# Patient Record
Sex: Female | Born: 1965 | Race: White | Hispanic: No | Smoking: Former smoker
Health system: Southern US, Community
[De-identification: ages and names within clinical notes are randomized; demographics above are authoritative.]

## PROBLEM LIST (undated history)

## (undated) DIAGNOSIS — F32A Depression, unspecified: Secondary | ICD-10-CM

## (undated) DIAGNOSIS — R87619 Unspecified abnormal cytological findings in specimens from cervix uteri: Secondary | ICD-10-CM

## (undated) DIAGNOSIS — F319 Bipolar disorder, unspecified: Secondary | ICD-10-CM

## (undated) DIAGNOSIS — F1910 Other psychoactive substance abuse, uncomplicated: Secondary | ICD-10-CM

## (undated) DIAGNOSIS — F419 Anxiety disorder, unspecified: Secondary | ICD-10-CM

## (undated) HISTORY — DX: Bipolar disorder, unspecified: F31.9

## (undated) HISTORY — PX: COSMETIC SURGERY: SHX468

## (undated) HISTORY — DX: Other psychoactive substance abuse, uncomplicated: F19.10

## (undated) HISTORY — PX: EYE SURGERY: SHX253

## (undated) HISTORY — PX: SPINE SURGERY: SHX786

## (undated) HISTORY — DX: Unspecified abnormal cytological findings in specimens from cervix uteri: R87.619

## (undated) HISTORY — PX: CERVICAL BIOPSY  W/ LOOP ELECTRODE EXCISION: SUR135

## (undated) HISTORY — PX: AUGMENTATION MAMMAPLASTY: SUR837

## (undated) HISTORY — PX: CRYOTHERAPY: SHX1416

## (undated) HISTORY — PX: BREAST SURGERY: SHX581

## (undated) HISTORY — DX: Depression, unspecified: F32.A

## (undated) HISTORY — DX: Anxiety disorder, unspecified: F41.9

---

## 2018-08-01 DIAGNOSIS — T1491XA Suicide attempt, initial encounter: Secondary | ICD-10-CM

## 2018-08-01 HISTORY — DX: Suicide attempt, initial encounter: T14.91XA

## 2019-08-24 DIAGNOSIS — F3181 Bipolar II disorder: Secondary | ICD-10-CM | POA: Insufficient documentation

## 2021-04-07 ENCOUNTER — Telehealth (HOSPITAL_COMMUNITY): Payer: Self-pay | Admitting: Licensed Clinical Social Worker

## 2021-04-09 NOTE — Telephone Encounter (Signed)
Cln oriented pt to PHP and offered virtual intake appt on 9/19 and pt states she cannot make that appt. Pt accepts appt on 9/20 at 1pm. Pt states current SI and denies plan/intent and states she is safe. Cln discussed pt voluntarily presenting to the ED to maintain safety and pt again denies plan or intent and states she is not interested in going inpatient. Cln provided info on [facility] and [facility] and encouraged pt to call or present at [facility]/ED if her SI worsens. Pt states she will.

## 2021-04-20 ENCOUNTER — Telehealth (HOSPITAL_COMMUNITY): Payer: Self-pay | Admitting: Licensed Clinical Social Worker

## 2021-04-20 ENCOUNTER — Other Ambulatory Visit (HOSPITAL_COMMUNITY): Payer: 59 | Attending: Psychiatry

## 2021-04-20 ENCOUNTER — Other Ambulatory Visit: Payer: Self-pay | Admitting: Physician Assistant

## 2021-04-20 ENCOUNTER — Other Ambulatory Visit: Payer: Self-pay

## 2021-04-20 ENCOUNTER — Ambulatory Visit
Admission: RE | Admit: 2021-04-20 | Discharge: 2021-04-20 | Disposition: A | Discharge status: Home / Self Care | Payer: 59 | Source: Ambulatory Visit | Attending: Physician Assistant | Admitting: Physician Assistant

## 2021-04-20 DIAGNOSIS — M545 Low back pain, unspecified: Secondary | ICD-10-CM

## 2021-05-25 ENCOUNTER — Telehealth (HOSPITAL_COMMUNITY): Payer: Self-pay | Admitting: Licensed Clinical Social Worker

## 2021-05-28 ENCOUNTER — Ambulatory Visit (INDEPENDENT_AMBULATORY_CARE_PROVIDER_SITE_OTHER): Payer: 59 | Admitting: Licensed Clinical Social Worker

## 2021-05-28 ENCOUNTER — Other Ambulatory Visit: Payer: Self-pay

## 2021-05-28 DIAGNOSIS — F1010 Alcohol abuse, uncomplicated: Secondary | ICD-10-CM | POA: Diagnosis not present

## 2021-05-28 DIAGNOSIS — F1510 Other stimulant abuse, uncomplicated: Secondary | ICD-10-CM

## 2021-05-28 DIAGNOSIS — F1994 Other psychoactive substance use, unspecified with psychoactive substance-induced mood disorder: Secondary | ICD-10-CM | POA: Diagnosis not present
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
## 2021-06-04 ENCOUNTER — Telehealth (HOSPITAL_COMMUNITY): Payer: Self-pay | Admitting: Licensed Clinical Social Worker

## 2021-06-07 ENCOUNTER — Telehealth (HOSPITAL_COMMUNITY): Payer: Self-pay | Admitting: Professional

## 2021-06-08 ENCOUNTER — Other Ambulatory Visit: Payer: Self-pay

## 2021-06-08 ENCOUNTER — Ambulatory Visit (INDEPENDENT_AMBULATORY_CARE_PROVIDER_SITE_OTHER): Payer: 59 | Admitting: Licensed Clinical Social Worker

## 2021-06-08 DIAGNOSIS — F1590 Other stimulant use, unspecified, uncomplicated: Secondary | ICD-10-CM

## 2021-06-08 NOTE — Progress Notes (Signed)
   THERAPIST PROGRESS NOTE  Session Time: 06-1144  Participation Level: Active  Behavioral Response: CasualAlertAnxious  Type of Therapy: Individual Therapy  Treatment Goals addressed: Coping and Diagnosis: stimulant use disorder  Interventions: CBT and Supportive  Summary: Ellise Kovack is a 55 y.o. female who presents with stimulant use disorder, substance induced mood disorder. Client showed progress toward goal of maintained sobriety 7/7 days weekly AEB maintained sobriety since previous session. Client continues to build sober support community by attending weekly meetings and has plan to obtain part time employment to help with trigger of boredom.   Suicidal/Homicidal: No  Therapist Response: Clinician checked in with client, assessed for SI/HI/psychosis. Clinician inquired about recent use, cravings, and 12 step meeting attendance. Clinician presented psycho-education on CBT triangle and the connection of thoughts, feelings, and behaviors. Clinician discussed with client the importance of repeating helpful thoughts against using. Clinician processed with client addiction as disease not moral failure. Clinician showed client brain scans related to pre, during, and post substance use and provided educational video for viewing prior to next session.   Plan: Return again in 2 weeks.  Diagnosis: Axis I:  stimulant use disorder        [name], LCSW 06/08/2021

## 2021-06-10 LAB — COLOGUARD: Cologuard: NEGATIVE

## 2021-06-17 LAB — COLOGUARD: COLOGUARD: NEGATIVE

## 2021-06-17 LAB — EXTERNAL GENERIC LAB PROCEDURE: COLOGUARD: NEGATIVE

## 2021-06-22 ENCOUNTER — Ambulatory Visit (HOSPITAL_COMMUNITY): Payer: 59 | Admitting: Licensed Clinical Social Worker

## 2021-06-29 ENCOUNTER — Ambulatory Visit (HOSPITAL_COMMUNITY): Payer: 59 | Admitting: Licensed Clinical Social Worker

## 2021-06-30 ENCOUNTER — Ambulatory Visit (INDEPENDENT_AMBULATORY_CARE_PROVIDER_SITE_OTHER): Payer: 59 | Admitting: Licensed Clinical Social Worker

## 2021-06-30 ENCOUNTER — Other Ambulatory Visit: Payer: Self-pay

## 2021-06-30 DIAGNOSIS — F1994 Other psychoactive substance use, unspecified with psychoactive substance-induced mood disorder: Secondary | ICD-10-CM

## 2021-06-30 DIAGNOSIS — F1510 Other stimulant abuse, uncomplicated: Secondary | ICD-10-CM | POA: Diagnosis not present

## 2021-06-30 NOTE — Progress Notes (Signed)
   THERAPIST PROGRESS NOTE  Session Time: 805-850  Participation Level: Active  Behavioral Response: Casual and NeatAlertEuthymic  Type of Therapy: Individual Therapy  Treatment Goals addressed: Coping and Diagnosis: substance use disorder  Interventions: CBT, Supportive, and Other: psycho-education  Summary: Maria Pineda is a 55 y.o. female who presents with substance use disorder. Client showed progress toward goal of achieving/maintaining sobriety 7/7 days weekly AEB reported 20 days sober. Client showed progress toward goal of building sober support system AEB finding new sponsor and continued regular attendance at [employer]. Client was receptive to psycho-education and identified some distorted roles in family of origin vs nuclear family.  Suicidal/Homicidal: No  Therapist Response: Clinician checked in with client, assessed for SI/HI/psychosis and overall level of functioning. Client presented fully oriented and did not appear intoxicated or psychotic at the time of assessment. Clinician presented psycho-educational information on dysfunctional family roles and the effect on behaviors as an adult. Following session to focus on ACOA traits.  Plan: Return again in 2 weeks.  Diagnosis: Axis I: Substance Abuse       [name], LCSW 06/30/2021

## 2021-07-14 ENCOUNTER — Ambulatory Visit (HOSPITAL_COMMUNITY): Payer: 59 | Admitting: Licensed Clinical Social Worker

## 2021-07-15 ENCOUNTER — Ambulatory Visit (HOSPITAL_COMMUNITY): Payer: 59 | Admitting: Licensed Clinical Social Worker

## 2021-07-21 ENCOUNTER — Ambulatory Visit (HOSPITAL_COMMUNITY): Payer: 59 | Admitting: Licensed Clinical Social Worker

## 2021-07-23 ENCOUNTER — Ambulatory Visit (HOSPITAL_COMMUNITY): Payer: 59 | Admitting: Licensed Clinical Social Worker

## 2021-07-23 ENCOUNTER — Other Ambulatory Visit: Payer: Self-pay

## 2021-07-23 NOTE — Progress Notes (Incomplete)
Virtual Visit via Telephone Note  I connected with Maria Pineda on 07/23/2021 at  8:00 AM EST by telephone and verified that I am speaking with the correct person using two identifiers.  Location: Patient: home Provider: office   I discussed the limitations, risks, security and privacy concerns of performing an evaluation and management service by telephone and the availability of in person appointments. I also discussed with the patient that there may be a patient responsible charge related to this service. The patient expressed understanding and agreed to proceed.  I discussed the assessment and treatment plan with the patient. The patient was provided an opportunity to ask questions and all were answered. The patient agreed with the plan and demonstrated an understanding of the instructions.   The patient was advised to call back or seek an in-person evaluation if the symptoms worsen or if the condition fails to improve as anticipated.  I provided 45 minutes of non-face-to-face time during this encounter.   [name], LCSW    THERAPIST PROGRESS NOTE  Session Time: 805-850am  Participation Level: Active  Behavioral Response: NAAlertEuthymic  Type of Therapy: Individual Therapy  Treatment Goals addressed: Coping; client will maintain sobriety from all mind altering substances 7/7 days weekly.  Interventions: CBT, Supportive, and Other: psycho-education  Summary: Maria Pineda is a 55 y.o. female who presents with stimulant use disorder.   Suicidal/Homicidal: No  Therapist Response: ***[place]; follow up with guilt/shame  Plan: Return again in *** weeks.  Diagnosis: Axis I: {psych axis 1:31909}    Axis II: {psych axis 2:31910}    [name], LCSW 07/23/2021

## 2021-07-27 ENCOUNTER — Other Ambulatory Visit (HOSPITAL_BASED_OUTPATIENT_CLINIC_OR_DEPARTMENT_OTHER): Payer: Self-pay | Admitting: Family Medicine

## 2021-07-27 ENCOUNTER — Telehealth (HOSPITAL_BASED_OUTPATIENT_CLINIC_OR_DEPARTMENT_OTHER): Payer: Self-pay

## 2021-07-27 DIAGNOSIS — Z1231 Encounter for screening mammogram for malignant neoplasm of breast: Secondary | ICD-10-CM

## 2021-08-05 ENCOUNTER — Other Ambulatory Visit: Payer: Self-pay

## 2021-08-05 ENCOUNTER — Ambulatory Visit (INDEPENDENT_AMBULATORY_CARE_PROVIDER_SITE_OTHER): Payer: 59 | Admitting: Licensed Clinical Social Worker

## 2021-08-05 DIAGNOSIS — F1510 Other stimulant abuse, uncomplicated: Secondary | ICD-10-CM | POA: Diagnosis not present

## 2021-08-05 NOTE — Progress Notes (Signed)
°  Virtual Visit via Video Note  I connected with Maria Pineda on 08/05/2021 at  9:00 AM EST by a video enabled telemedicine application and verified that I am speaking with the correct person using two identifiers.  Location: Patient: home Provider: office   I discussed the limitations of evaluation and management by telemedicine and the availability of in person appointments. The patient expressed understanding and agreed to proceed.    I discussed the assessment and treatment plan with the patient. The patient was provided an opportunity to ask questions and all were answered. The patient agreed with the plan and demonstrated an understanding of the instructions.   The patient was advised to call back or seek an in-person evaluation if the symptoms worsen or if the condition fails to improve as anticipated.  I provided 45 minutes of non-face-to-face time during this encounter.   [name], LCSW   THERAPIST PROGRESS NOTE  Session Time: [phone]  Participation Level: Active  Behavioral Response: CasualAlertDysphoric  Type of Therapy: Individual Therapy  Treatment Goals addressed: Coping and Diagnosis: stimulant abuse  Interventions: CBT and Supportive  Summary: Maria Pineda is a 56 y.o. female who presents with stimulant use disorder, early remission. Clinician and presented skill of Radical acceptance. Clinician and client processed the role of acceptance in recovery. Clinician and client processed the roll of acceptance in relation to guilt and relationship with children. Clinician validated client thoughts and feelings. Clinician challenged client distorted thinking and focused on fact vs feelings. Client was receptive to skill though identified and voiced challenges for her on this topic. Client showed progress toward goal AEB maintained sobriety 7/7 days weekly. Client reported changes in work schedule are helpful and clinician challenged client to engage in leisure  activities and engage with community support members to address boredom or isolation.  Suicidal/Homicidal: No    Plan: Return again in 2 weeks.  Diagnosis: Axis I: Substance Abuse       [name], LCSW 08/05/2021

## 2021-08-06 ENCOUNTER — Ambulatory Visit (HOSPITAL_BASED_OUTPATIENT_CLINIC_OR_DEPARTMENT_OTHER)
Admission: RE | Admit: 2021-08-06 | Discharge: 2021-08-06 | Disposition: A | Discharge status: Home / Self Care | Payer: 59 | Source: Ambulatory Visit | Attending: Family Medicine | Admitting: Family Medicine

## 2021-08-06 ENCOUNTER — Other Ambulatory Visit: Payer: Self-pay

## 2021-08-06 ENCOUNTER — Encounter (HOSPITAL_BASED_OUTPATIENT_CLINIC_OR_DEPARTMENT_OTHER): Payer: Self-pay

## 2021-08-06 DIAGNOSIS — Z1231 Encounter for screening mammogram for malignant neoplasm of breast: Secondary | ICD-10-CM | POA: Insufficient documentation

## 2021-08-12 ENCOUNTER — Ambulatory Visit (INDEPENDENT_AMBULATORY_CARE_PROVIDER_SITE_OTHER): Payer: 59 | Admitting: Licensed Clinical Social Worker

## 2021-08-12 ENCOUNTER — Other Ambulatory Visit: Payer: Self-pay

## 2021-08-12 DIAGNOSIS — F1994 Other psychoactive substance use, unspecified with psychoactive substance-induced mood disorder: Secondary | ICD-10-CM | POA: Diagnosis not present

## 2021-08-12 DIAGNOSIS — F1510 Other stimulant abuse, uncomplicated: Secondary | ICD-10-CM | POA: Diagnosis not present

## 2021-08-12 NOTE — Progress Notes (Signed)
Virtual Visit via Video Note  I connected with Maria Pineda on 08/12/21 at  9:00 AM EST by a video enabled telemedicine application and verified that I am speaking with the correct person using two identifiers.  Location: Patient: home Provider: office   I discussed the limitations of evaluation and management by telemedicine and the availability of in person appointments. The patient expressed understanding and agreed to proceed.   I discussed the assessment and treatment plan with the patient. The patient was provided an opportunity to ask questions and all were answered. The patient agreed with the plan and demonstrated an understanding of the instructions.   The patient was advised to call back or seek an in-person evaluation if the symptoms worsen or if the condition fails to improve as anticipated.  I provided 45 minutes of non-face-to-face time during this encounter.   [name], LCSW    THERAPIST PROGRESS NOTE  Session Time: [phone]  Participation Level: Active  Behavioral Response: CasualAlert"neutral"  Type of Therapy: Individual Therapy  Treatment Goals addressed: Coping and Diagnosis: maintain sobriety from all mind/mood altering substances 7/7 days weekly.  Interventions: CBT, Motivational Interviewing, and Supportive  Summary: Maria Pineda is a 56 y.o. female who presents with stimulant use disorder, no current use. Client shared work going well however need for more engagement in social activities outside of the home to minimize boredom and isolation. Client continues to struggle with guilt and shame around using substances when children were younger and possible effect of them in addition to current lack of relationship. Client showed progress toward goal of maintained sobriety AEB no substance use since previous session.  Suicidal/Homicidal: No  Therapist Response: Clinician checked in with client, assessed for SI/HI/psychosis and overall level of  functioning. Clinician processed with client addiction as a disease vs a moral failure. Clinician worked with client on concept of radical acceptance to avoid suffering and move forward. Clinician and client spoke about differences in healthy guilt vs unhealthy guilt vs shame. Clinician validated feelings and attempted to support client in current healthy decisions being made for self and family. Clinician spoke with client about transition to new therapist.  Plan: Return again in 1-2 weeks.  Diagnosis: Axis I: Substance Abuse and Substance Induced Mood Disorder        [name], LCSW 08/12/2021

## 2021-08-20 ENCOUNTER — Other Ambulatory Visit: Payer: Self-pay

## 2021-08-20 ENCOUNTER — Ambulatory Visit (INDEPENDENT_AMBULATORY_CARE_PROVIDER_SITE_OTHER): Payer: 59 | Admitting: Licensed Clinical Social Worker

## 2021-08-20 DIAGNOSIS — F1510 Other stimulant abuse, uncomplicated: Secondary | ICD-10-CM | POA: Diagnosis not present

## 2021-08-23 NOTE — Progress Notes (Signed)
Virtual Visit via Video Note  I connected with Maria Pineda on 08/20/2021 at  9:00 AM EST by a video enabled telemedicine application and verified that I am speaking with the correct person using two identifiers.  Location: Patient: home Provider: home office   I discussed the limitations of evaluation and management by telemedicine and the availability of in person appointments. The patient expressed understanding and agreed to proceed.  I discussed the assessment and treatment plan with the patient. The patient was provided an opportunity to ask questions and all were answered. The patient agreed with the plan and demonstrated an understanding of the instructions.   The patient was advised to call back or seek an in-person evaluation if the symptoms worsen or if the condition fails to improve as anticipated.  I provided 45 minutes of non-face-to-face time during this encounter.   [name], LCSW    THERAPIST PROGRESS NOTE  Session Time: [phone]  Participation Level: Active  Behavioral Response: Casual and NeatAlertDysphoric  Type of Therapy: Individual Therapy  Treatment Goals addressed: Coping and Diagnosis: Client will maintain sobriety 7/7 days weekly.  Interventions: CBT and Supportive  Summary: Maria Pineda is a 56 y.o. female who presents with stimulate use disorder in early remission. Client processed increase in work and problem solving for younger co-workers as well as decrease in engagement with personal recovery. Client worked to identify co-dependent behaviors previously and current pattern. Client identified relevant information from ACOA laundry list in behaviors including guilt around taking care of self and possible enabling behaviors of putting work and others before personal recovery.   Suicidal/Homicidal: No  Therapist Response: Clinician checked in with client, assessed for SI/HI/psychosis and overall level of functioning including substance use and  attendance at AA/NA meetings. Clinician worked with client to identify behaviors previously used to focus on others rather than self. Clinician redirected negative self talk and focus on coworkers problems. Clinician and client reviewed options for relapse prevention including returning to meetings, engaging in social activities with peers her age to decrease isolating.  Plan: Return again in 1-2 weeks.  Diagnosis: Axis I: Substance Abuse      [name], LCSW 08/20/2021
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
## 2021-08-27 ENCOUNTER — Other Ambulatory Visit: Payer: Self-pay

## 2021-08-27 ENCOUNTER — Ambulatory Visit (INDEPENDENT_AMBULATORY_CARE_PROVIDER_SITE_OTHER): Payer: 59 | Admitting: Licensed Clinical Social Worker

## 2021-08-27 DIAGNOSIS — F1510 Other stimulant abuse, uncomplicated: Secondary | ICD-10-CM

## 2021-09-02 ENCOUNTER — Ambulatory Visit (INDEPENDENT_AMBULATORY_CARE_PROVIDER_SITE_OTHER): Payer: 59 | Admitting: Licensed Clinical Social Worker

## 2021-09-02 ENCOUNTER — Other Ambulatory Visit: Payer: Self-pay

## 2021-09-02 DIAGNOSIS — F1510 Other stimulant abuse, uncomplicated: Secondary | ICD-10-CM | POA: Diagnosis not present

## 2021-09-02 DIAGNOSIS — F1994 Other psychoactive substance use, unspecified with psychoactive substance-induced mood disorder: Secondary | ICD-10-CM

## 2021-09-02 NOTE — Progress Notes (Signed)
Virtual Visit via Video Note  I connected with Maria Pineda on 09/02/21 at  9:00 AM EST by a video enabled telemedicine application and verified that I am speaking with the correct person using two identifiers.  Location: Patient: home Provider: office   I discussed the limitations of evaluation and management by telemedicine and the availability of in person appointments. The patient expressed understanding and agreed to proceed. I discussed the assessment and treatment plan with the patient. The patient was provided an opportunity to ask questions and all were answered. The patient agreed with the plan and demonstrated an understanding of the instructions.   The patient was advised to call back or seek an in-person evaluation if the symptoms worsen or if the condition fails to improve as anticipated.  I provided 45 minutes of non-face-to-face time during this encounter.   [name], LCSW    THERAPIST PROGRESS NOTE  Session Time: [phone]  Participation Level: Active  Behavioral Response: NAAlertEuthymic  Type of Therapy: Individual Therapy  Treatment Goals addressed: Coping and Diagnosis: Maintain sobriety 7/7 days weekly.  Interventions: CBT, DBT, and Supportive  Summary: Maria Pineda is a 56 y.o. female who presents with stimulant use disorder. Client showed progress toward goal AEB maintained sobriety since previous session. Client reported improved mood and ability to engage in recovery related activities after being able to start latuda again after insurance trouble. Client identified using excessive work hours over the holidays as avoidance of feelings related to not being around family over the holiday. Client has goals with new therapist to address guilt/shame around behaviors resulting in her moving to [place] and children no longer having contact with her. Clinician and client continued to discuss radical acceptance and vulnerability   Suicidal/Homicidal:  No  Therapist Response: Clinician checked in with client, assessed for SI/hI/psychosis and overall level of functioning including substance use and engagement with community support groups. Clinician processed with client acceptance of all feelings "comfortable" or "uncomfortable" and uses for emotions. Clinician and client continued discussion on radical acceptance and not avoiding feelings.  Plan: Return again in 1 weeks. Future goals to address codependency and guilt/shame around moving to [place] and behaviors leading up to children not talking with her  Diagnosis: Axis I: Substance Abuse and Substance Induced Mood Disorder       [name], LCSW 09/02/2021

## 2021-09-02 NOTE — Progress Notes (Signed)
Maria Pineda is a 56 y.o. female patient who presented for individual session virtually. Due to changes in medication client was drowsy and unable to complete session. Session re-scheduled for following week.        [name], LCSW

## 2021-09-09 ENCOUNTER — Other Ambulatory Visit: Payer: Self-pay

## 2021-09-09 ENCOUNTER — Ambulatory Visit (INDEPENDENT_AMBULATORY_CARE_PROVIDER_SITE_OTHER): Payer: 59 | Admitting: Licensed Clinical Social Worker

## 2021-09-09 DIAGNOSIS — F1510 Other stimulant abuse, uncomplicated: Secondary | ICD-10-CM | POA: Diagnosis not present

## 2021-09-09 DIAGNOSIS — F1994 Other psychoactive substance use, unspecified with psychoactive substance-induced mood disorder: Secondary | ICD-10-CM | POA: Diagnosis not present

## 2021-09-09 NOTE — Progress Notes (Signed)
Virtual Visit via Video Note  I connected with Maria Pineda on 09/09/21 at  9:00 AM EST by a video enabled telemedicine application and verified that I am speaking with the correct person using two identifiers.  Location: Patient: home Provider: office   I discussed the limitations of evaluation and management by telemedicine and the availability of in person appointments. The patient expressed understanding and agreed to proceed.   I discussed the assessment and treatment plan with the patient. The patient was provided an opportunity to ask questions and all were answered. The patient agreed with the plan and demonstrated an understanding of the instructions.   The patient was advised to call back or seek an in-person evaluation if the symptoms worsen or if the condition fails to improve as anticipated.  I provided 45 minutes of non-face-to-face time during this encounter.   [name], LCSW    THERAPIST PROGRESS NOTE  Session Time: 9-945am  Participation Level: Active  Behavioral Response: CasualAlertIrritable  Type of Therapy: Individual Therapy  Treatment Goals addressed: Coping and Diagnosis: maintain sobriety 7/7 days weekly  Interventions: CBT, Motivational Interviewing, and Supportive  Summary: Maria Pineda is a 56 y.o. female who presents with stimulant use disorder. Client processed thoughts around answers to questions provided by sponsor on step one. Client verbalized frustration with automatic thoughts related to addiction and not enjoying sobriety. Clinician processed with client expectations in early recovery and provided psycho-education on time line for healing the brain in recovery. Clinician validated client feelings and explored client expectations for live changes following sobriety.  Suicidal/Homicidal: No   Plan: Return again in 1 week with new provider.  Diagnosis: Axis I: Substance Abuse and Substance Induced Mood Disorder     [name], LCSW 09/09/2021

## 2021-09-16 ENCOUNTER — Ambulatory Visit (INDEPENDENT_AMBULATORY_CARE_PROVIDER_SITE_OTHER): Payer: 59 | Admitting: Licensed Clinical Social Worker

## 2021-09-16 ENCOUNTER — Other Ambulatory Visit: Payer: Self-pay

## 2021-09-16 DIAGNOSIS — F1510 Other stimulant abuse, uncomplicated: Secondary | ICD-10-CM | POA: Diagnosis not present

## 2021-09-16 DIAGNOSIS — F1994 Other psychoactive substance use, unspecified with psychoactive substance-induced mood disorder: Secondary | ICD-10-CM

## 2021-09-16 NOTE — Progress Notes (Signed)
Virtual Visit via Video Note  I connected with Maria Pineda on 09/16/21 at  9:00 AM EST by a video enabled telemedicine application and verified that I am speaking with the correct person using two identifiers.  Location: Patient: home Provider: home office   I discussed the limitations of evaluation and management by telemedicine and the availability of in person appointments. The patient expressed understanding and agreed to proceed.   I discussed the assessment and treatment plan with the patient. The patient was provided an opportunity to ask questions and all were answered. The patient agreed with the plan and demonstrated an understanding of the instructions.   The patient was advised to call back or seek an in-person evaluation if the symptoms worsen or if the condition fails to improve as anticipated.  I provided 45 minutes of non-face-to-face time during this encounter.   Dimitri Shakespeare S, LCAS    THERAPIST PROGRESS NOTE  Session Time: 9-945am  Participation Level: Active  Behavioral Response: CasualAlert  Type of Therapy: Individual Therapy  Treatment Goals addressed: Coping and Diagnosis: maintain sobriety 7/7 days weekly  Interventions: Supportive  Summary: Maria Pineda is a 56 y.o. female who presents with stimulant use disorder. Today is the first day of therapy with this therapist, after her therapist left the practice. Cln and pt spent the session building a trusting, therapeutic relationship and gaining background information about her life of addiction, recovery.     Suicidal/Homicidal: No  Assessment and Plan: Counselor will continue to meet with patient to address treatment plan goals. Patient will continue to follow recommendations of providers and implement skills learned in session.   Follow Up Instructions: I discussed the assessment and treatment plan with the patient. The patient was provided an opportunity to ask questions and all were  answered. The patient agreed with the plan and demonstrated an  understanding of the instructions.   The patient was advised to call back or seek an in-person evaluation if the symptoms worsen or if the condition fails to improve as anticipated.   Collaboration of Care: No one at this session  Patient/Guardian was advised Release of Information must be obtained prior to any record release in order to collaborate their care with an outside provider. Patient/Guardian was advised if they have not already done so to contact the registration department to sign all necessary forms in order for [name] to release information regarding their care.   Consent: Patient/Guardian gives verbal consent for treatment and assignment of benefits for services provided during this visit. Patient/Guardian expressed understanding and agreed to proceed.      Diagnosis: Axis I: Stimulant abuse and substance-induced mood disorder     Jae Bruck S, LCAS 09/16/2021

## 2021-09-23 ENCOUNTER — Telehealth (INDEPENDENT_AMBULATORY_CARE_PROVIDER_SITE_OTHER): Payer: 59 | Admitting: Licensed Clinical Social Worker

## 2021-09-23 ENCOUNTER — Other Ambulatory Visit: Payer: Self-pay

## 2021-09-23 ENCOUNTER — Encounter (HOSPITAL_COMMUNITY): Payer: Self-pay | Admitting: Licensed Clinical Social Worker

## 2021-09-23 DIAGNOSIS — F1510 Other stimulant abuse, uncomplicated: Secondary | ICD-10-CM

## 2021-09-23 DIAGNOSIS — F1994 Other psychoactive substance use, unspecified with psychoactive substance-induced mood disorder: Secondary | ICD-10-CM

## 2021-09-23 NOTE — Progress Notes (Signed)
Virtual Visit via Video Note  I connected with Maria Pineda on 09/23/21 at  9:00 AM EST by a video enabled telemedicine application and verified that I am speaking with the correct person using two identifiers.  Location: Patient: home Provider: home office   I discussed the limitations of evaluation and management by telemedicine and the availability of in person appointments. The patient expressed understanding and agreed to proceed.   I discussed the assessment and treatment plan with the patient. The patient was provided an opportunity to ask questions and all were answered. The patient agreed with the plan and demonstrated an understanding of the instructions.   The patient was advised to call back or seek an in-person evaluation if the symptoms worsen or if the condition fails to improve as anticipated.  I provided 45 minutes of non-face-to-face time during this encounter.   Percival Glasheen S, LCAS    THERAPIST PROGRESS NOTE  Session Time: 9-945am  Participation Level: Active  Behavioral Response: CasualAlert  Type of Therapy: Individual Therapy  Treatment Goals addressed: Pt will meet with clinician in person weekly for therapy to monitor for progress towards goals and address any barriers to success; Reduce depression from average severity level of 6/10 down to a 4/10 in next 90 days by engaging in 1-3 positive coping skills daily as part of developing self-care routine; Reduce average anxiety level from 7/10 down to 5/10 in next 90 days by utilizing 1-3 relaxation skills/grounding skills per day, such as mindful breathing, progressive muscle relaxation, positive visualizations. Pt will remain free from all substances for the next 90 days as evidenced by self-report, meeting attendance, step work, meet with sponsor.   Progress Towards Goal: Initial   Interventions: Supportive  Summary: Patient presented for today's session on time and was alert, oriented x5, with no  evidence or self-report of SI/HI or A/V H.  Patient reported ongoing compliance with medication and denied any use of alcohol or illicit substances since last session. Clinician inquired about patient's current emotional ratings, as well as any significant changes in thoughts, feelings or behavior since previous session.  Patient reported scores of 6/10 for depression, 6/10 for anxiety, 1/10 for anger/irritability, and denied any reoccurrence of panic attacks. During the session cln and pt completed assessments: Pain, nutrition, suicidality, depression. During the session cln and pt completed the tx plan, identifying goals/strategies. Clinician processed with client changes over the previous week to support sobriety and build recovery network. Clinician praised client engagement with 12 step community, meeting attendance and sponsor/step work.      Suicidal/Homicidal: No  Assessment and Plan: Counselor will continue to meet with patient to address treatment plan goals. Patient will continue to follow recommendations of providers and implement skills learned in session.   Follow Up Instructions: I discussed the assessment and treatment plan with the patient. The patient was provided an opportunity to ask questions and all were answered. The patient agreed with the plan and demonstrated an  understanding of the instructions.   The patient was advised to call back or seek an in-person evaluation if the symptoms worsen or if the condition fails to improve as anticipated.   Collaboration of Care: No one at this session  Patient/Guardian was advised Release of Information must be obtained prior to any record release in order to collaborate their care with an outside provider. Patient/Guardian was advised if they have not already done so to contact the registration department to sign all necessary forms in order for [name] to  release information regarding their care.   Consent: Patient/Guardian gives verbal  consent for treatment and assignment of benefits for services provided during this visit. Patient/Guardian expressed understanding and agreed to proceed.      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

## 2021-09-30 ENCOUNTER — Ambulatory Visit (INDEPENDENT_AMBULATORY_CARE_PROVIDER_SITE_OTHER): Payer: 59 | Admitting: Licensed Clinical Social Worker

## 2021-09-30 ENCOUNTER — Encounter (HOSPITAL_COMMUNITY): Payer: Self-pay | Admitting: Licensed Clinical Social Worker

## 2021-09-30 ENCOUNTER — Other Ambulatory Visit: Payer: Self-pay

## 2021-09-30 DIAGNOSIS — F1510 Other stimulant abuse, uncomplicated: Secondary | ICD-10-CM | POA: Diagnosis not present

## 2021-09-30 DIAGNOSIS — F1994 Other psychoactive substance use, unspecified with psychoactive substance-induced mood disorder: Secondary | ICD-10-CM

## 2021-09-30 NOTE — Progress Notes (Signed)
Virtual Visit via Video Note ? ?I connected with Maria Pineda on 09/30/21 at  9:00 AM EST by a video enabled telemedicine application and verified that I am speaking with the correct person using two identifiers. ? ?Location: ?Patient: home ?Provider: home office ?  ?I discussed the limitations of evaluation and management by telemedicine and the availability of in person appointments. The patient expressed understanding and agreed to proceed. ?  ?I discussed the assessment and treatment plan with the patient. The patient was provided an opportunity to ask questions and all were answered. The patient agreed with the plan and demonstrated an understanding of the instructions. ?  ?The patient was advised to call back or seek an in-person evaluation if the symptoms worsen or if the condition fails to improve as anticipated. ? ?I provided 45 minutes of non-face-to-face time during this encounter. ? ? ?Maria Pineda, LCAS ? ? ? ?THERAPIST PROGRESS NOTE ? ?Session Time: 9-945am ? ?Participation Level: Active ? ?Behavioral Response: CasualAlert ? ?Type of Therapy: Individual Therapy ? ?Treatment Goals addressed: Pt will meet with clinician in person weekly for therapy to monitor for progress towards goals and address any barriers to success; Reduce depression from average severity level of 6/10 down to a 4/10 in next 90 days by engaging in 1-3 positive coping skills daily as part of developing self-care routine; Reduce average anxiety level from 7/10 down to 5/10 in next 90 days by utilizing 1-3 relaxation skills/grounding skills per day, such as mindful breathing, progressive muscle relaxation, positive visualizations. Pt will remain free from all substances for the next 90 days as evidenced by self-report, meeting attendance, step work, meet with sponsor.  ? ?Progress Towards Goal: Progressing ? ? ?Interventions: Supportive ? ?Summary: Patient presented for today'Pineda session on time and was alert, oriented x5, with  no evidence or self-report of SI/HI or A/V H.  Patient reported ongoing compliance with medication and denied any use of alcohol or illicit substances since last session. Clinician inquired about patient'Pineda current emotional ratings, as well as any significant changes in thoughts, feelings or behavior since previous session.  Patient reported scores of 6/10 for depression, 6/10 for anxiety, 1/10 for anger/irritability, and denied any panic attacks. Pt spent most of the session talking about her losses due to her addiction, how her addiction began, and some psychiatric history. Clinician processed with client changes over the previous week to support sobriety and build recovery network. Clinician praised client engagement with 12 step community, meeting attendance and sponsor/step work.  ? ? ? ? ?Suicidal/Homicidal: No ? ?Assessment and Plan: Counselor will continue to meet with patient to address treatment plan goals. Patient will continue to follow recommendations of providers and implement skills learned in session. ?  ?Follow Up Instructions: I discussed the assessment and treatment plan with the patient. The patient was provided an opportunity to ask questions and all were answered. The patient agreed with the plan and demonstrated an  understanding of the instructions. ?  ?The patient was advised to call back or seek an in-person evaluation if the symptoms worsen or if the condition fails to improve as anticipated. ? ? ?Collaboration of Care: No one at this session ? ?Patient/Guardian was advised Release of Information must be obtained prior to any record release in order to collaborate their care with an outside provider. Patient/Guardian was advised if they have not already done so to contact the registration department to sign all necessary forms in order for [name] to release information regarding their  care.  ? ?Consent: Patient/Guardian gives verbal consent for treatment and assignment of benefits for services  provided during this visit. Patient/Guardian expressed understanding and agreed to proceed.   ? ? ? 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 ? ? ? ?Maria Pineda, LCAS ?09/30/2021 ? ?

## 2021-10-07 ENCOUNTER — Ambulatory Visit (INDEPENDENT_AMBULATORY_CARE_PROVIDER_SITE_OTHER): Payer: 59 | Admitting: Licensed Clinical Social Worker

## 2021-10-07 ENCOUNTER — Encounter (HOSPITAL_COMMUNITY): Payer: Self-pay | Admitting: Licensed Clinical Social Worker

## 2021-10-07 ENCOUNTER — Other Ambulatory Visit: Payer: Self-pay

## 2021-10-07 DIAGNOSIS — F1510 Other stimulant abuse, uncomplicated: Secondary | ICD-10-CM

## 2021-10-07 DIAGNOSIS — F1994 Other psychoactive substance use, unspecified with psychoactive substance-induced mood disorder: Secondary | ICD-10-CM | POA: Diagnosis not present

## 2021-10-07 NOTE — Progress Notes (Signed)
Virtual Visit via Video Note ? ?I connected with Maria Pineda on 10/07/21 at  9:00 AM EST by a video enabled telemedicine application and verified that I am speaking with the correct person using two identifiers. ? ?Location: ?Patient: home ?Provider: home office ?  ?I discussed the limitations of evaluation and management by telemedicine and the availability of in person appointments. The patient expressed understanding and agreed to proceed. ?  ?I discussed the assessment and treatment plan with the patient. The patient was provided an opportunity to ask questions and all were answered. The patient agreed with the plan and demonstrated an understanding of the instructions. ?  ?The patient was advised to call back or seek an in-person evaluation if the symptoms worsen or if the condition fails to improve as anticipated. ? ?I provided 45 minutes of non-face-to-face time during this encounter. ? ? ?Forest Redwine Pineda, LCAS ? ? ? ?THERAPIST PROGRESS NOTE ? ?Session Time: 9:00-9:45 am ? ?Participation Level: Active ? ?Behavioral Response: CasualAlert ? ?Type of Therapy: Individual Therapy ? ?Treatment Goals addressed: Pt will meet with clinician in person weekly for therapy to monitor for progress towards goals and address any barriers to success; Reduce depression from average severity level of 6/10 down to a 4/10 in next 90 days by engaging in 1-3 positive coping skills daily as part of developing self-care routine; Reduce average anxiety level from 7/10 down to 5/10 in next 90 days by utilizing 1-3 relaxation skills/grounding skills per day, such as mindful breathing, progressive muscle relaxation, positive visualizations. Pt will remain free from all substances for the next 90 days as evidenced by self-report, meeting attendance, step work, meet with sponsor.  ? ?Progress Towards Goal: Progressing ? ? ?Interventions: Supportive ? ?Summary: Patient presented for today'Pineda session on time and was alert, oriented x5,  with no evidence or self-report of SI/HI or A/V H.  Patient reported ongoing compliance with medication and denied any use of alcohol or illicit substances since last session. Clinician inquired about patient'Pineda current emotional ratings, as well as any significant changes in thoughts, feelings or behavior since previous session.  Patient reported scores of 6/10 for depression, 6/10 for anxiety, 1/10 for anger/irritability, and denied any panic attacks. Pt reports, "I'm starting my 2nd step with my sponsor and I'm struggling with a higher power." Cln and pt explored higher power. Clinician processed with client changes over the previous week to support sobriety and build recovery network. Clinician praised client engagement with 12 step community, meeting attendance and sponsor/step work.  ? ? ? ? ?Suicidal/Homicidal: No ? ?Assessment and Plan: Counselor will continue to meet with patient to address treatment plan goals. Patient will continue to follow recommendations of providers and implement skills learned in session. ?  ?Follow Up Instructions: I discussed the assessment and treatment plan with the patient. The patient was provided an opportunity to ask questions and all were answered. The patient agreed with the plan and demonstrated an  understanding of the instructions. ?  ?The patient was advised to call back or seek an in-person evaluation if the symptoms worsen or if the condition fails to improve as anticipated. ? ? ?Collaboration of Care: None needed at this session ? ?Patient/Guardian was advised Release of Information must be obtained prior to any record release in order to collaborate their care with an outside provider. Patient/Guardian was advised if they have not already done so to contact the registration department to sign all necessary forms in order for [name] to release information  regarding their care.  ? ?Consent: Patient/Guardian gives verbal consent for treatment and assignment of benefits for  services provided during this visit. Patient/Guardian expressed understanding and agreed to proceed.   ? ? ? 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 ? ? ? ?Maria Pineda, LCAS ?10/07/2021 ? ?

## 2021-10-14 ENCOUNTER — Ambulatory Visit (HOSPITAL_COMMUNITY): Payer: 59 | Admitting: Licensed Clinical Social Worker

## 2021-10-21 ENCOUNTER — Ambulatory Visit (HOSPITAL_COMMUNITY): Payer: 59 | Admitting: Licensed Clinical Social Worker

## 2021-10-28 ENCOUNTER — Encounter (HOSPITAL_COMMUNITY): Payer: Self-pay | Admitting: Licensed Clinical Social Worker

## 2021-10-28 ENCOUNTER — Ambulatory Visit (INDEPENDENT_AMBULATORY_CARE_PROVIDER_SITE_OTHER): Payer: 59 | Admitting: Licensed Clinical Social Worker

## 2021-10-28 DIAGNOSIS — F1994 Other psychoactive substance use, unspecified with psychoactive substance-induced mood disorder: Secondary | ICD-10-CM | POA: Diagnosis not present

## 2021-10-28 DIAGNOSIS — F1510 Other stimulant abuse, uncomplicated: Secondary | ICD-10-CM | POA: Diagnosis not present

## 2021-10-28 NOTE — Progress Notes (Signed)
Virtual Visit via Video Note ? ?I connected with Maria Pineda on 10/28/21 at  9:00 AM EDT by a video enabled telemedicine application and verified that I am speaking with the correct person using two identifiers. ? ?Location: ?Patient: home ?Provider: home office ?  ?I discussed the limitations of evaluation and management by telemedicine and the availability of in person appointments. The patient expressed understanding and agreed to proceed. ?  ?I discussed the assessment and treatment plan with the patient. The patient was provided an opportunity to ask questions and all were answered. The patient agreed with the plan and demonstrated an understanding of the instructions. ?  ?The patient was advised to call back or seek an in-person evaluation if the symptoms worsen or if the condition fails to improve as anticipated. ? ?I provided 45 minutes of non-face-to-face time during this encounter. ? ? ?Gloriajean Okun S, LCAS ? ? ? ?THERAPIST PROGRESS NOTE ? ?Session Time: 9:00-9:45 am ? ?Participation Level: Active ? ?Behavioral Response: CasualAlert ? ?Type of Therapy: Individual Therapy ? ?Treatment Goals addressed: Pt will meet with clinician in person weekly for therapy to monitor for progress towards goals and address any barriers to success; Reduce depression from average severity level of 6/10 down to a 4/10 in next 90 days by engaging in 1-3 positive coping skills daily as part of developing self-care routine; Reduce average anxiety level from 7/10 down to 5/10 in next 90 days by utilizing 1-3 relaxation skills/grounding skills per day, such as mindful breathing, progressive muscle relaxation, positive visualizations. Pt will remain free from all substances for the next 90 days as evidenced by self-report, meeting attendance, step work, meet with sponsor.  ? ?Progress Towards Goal: Progressing ? ? ?Interventions: Supportive ? ?Summary: Patient presented for today's session on time and was alert, oriented x5,  with no evidence or self-report of SI/HI or A/V H.  Patient reported ongoing compliance with medication and denied any use of alcohol or illicit substances since last session. Clinician inquired about patient's current emotional ratings, as well as any significant changes in thoughts, feelings or behavior since previous session.  Patient reported scores of 6/10 for depression, 6/10 for anxiety, 1/10 for anger/irritability, and denied any panic attacks. Pt reports she has been working a lot of additional hours, which "gave me permission to not work on my recovery." Cln asked open-ended questions. Pt has started working with her sponsor again on the 2nd step. Again, cln suggested pt to research "looking for my higher power." Pt reports she is struggling with "Shame in Recovery and Letting Go." Cln emailed pt worksheets and will review it at next session.  ? ? ?Suicidal/Homicidal: No ? ?Assessment and Plan: Counselor will continue to meet with patient to address treatment plan goals. Patient will continue to follow recommendations of providers and implement skills learned in session. ?  ?Follow Up Instructions: I discussed the assessment and treatment plan with the patient. The patient was provided an opportunity to ask questions and all were answered. The patient agreed with the plan and demonstrated an  understanding of the instructions. ?  ?The patient was advised to call back or seek an in-person evaluation if the symptoms worsen or if the condition fails to improve as anticipated. ? ? ?Collaboration of Care: None needed at this session ? ?Patient/Guardian was advised Release of Information must be obtained prior to any record release in order to collaborate their care with an outside provider. Patient/Guardian was advised if they have not already done so  to contact the registration department to sign all necessary forms in order for [name] to release information regarding their care.  ? ?Consent: Patient/Guardian  gives verbal consent for treatment and assignment of benefits for services provided during this visit. Patient/Guardian expressed understanding and agreed to proceed.   ? ? ? 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 ? ? ? ?Prue Lingenfelter S, LCAS ?10/28/2021 ? ?

## 2021-11-03 ENCOUNTER — Ambulatory Visit (INDEPENDENT_AMBULATORY_CARE_PROVIDER_SITE_OTHER): Payer: 59 | Admitting: Licensed Clinical Social Worker

## 2021-11-03 ENCOUNTER — Encounter (HOSPITAL_COMMUNITY): Payer: Self-pay | Admitting: Licensed Clinical Social Worker

## 2021-11-03 DIAGNOSIS — F1994 Other psychoactive substance use, unspecified with psychoactive substance-induced mood disorder: Secondary | ICD-10-CM

## 2021-11-03 DIAGNOSIS — F1510 Other stimulant abuse, uncomplicated: Secondary | ICD-10-CM | POA: Diagnosis not present

## 2021-11-03 NOTE — Progress Notes (Signed)
Virtual Visit via Video Note ? ?I connected with Maria Pineda on 11/03/21 at 9:30am by a video enabled telemedicine application and verified that I am speaking with the correct person using two identifiers. ? ?Location: ?Patient: home ?Provider: home office ?  ?I discussed the limitations of evaluation and management by telemedicine and the availability of in person appointments. The patient expressed understanding and agreed to proceed. ?  ?I discussed the assessment and treatment plan with the patient. The patient was provided an opportunity to ask questions and all were answered. The patient agreed with the plan and demonstrated an understanding of the instructions. ?  ?The patient was advised to call back or seek an in-person evaluation if the symptoms worsen or if the condition fails to improve as anticipated. ? ?I provided 30 minutes of non-face-to-face time during this encounter. ? ? ?Brynlei Klausner S, LCAS ? ? ? ?THERAPIST PROGRESS NOTE ? ?Session Time: 9:30-10:00 am ? ?Participation Level: Active ? ?Behavioral Response: CasualAlert ? ?Type of Therapy: Individual Therapy ? ?Treatment Goals addressed: Pt will meet with clinician in person weekly for therapy to monitor for progress towards goals and address any barriers to success; Reduce depression from average severity level of 6/10 down to a 4/10 in next 90 days by engaging in 1-3 positive coping skills daily as part of developing self-care routine; Reduce average anxiety level from 7/10 down to 5/10 in next 90 days by utilizing 1-3 relaxation skills/grounding skills per day, such as mindful breathing, progressive muscle relaxation, positive visualizations. Pt will remain free from all substances for the next 90 days as evidenced by self-report, meeting attendance, step work, meet with sponsor.  ? ?Progress Towards Goal: Progressing ? ? ?Interventions: Supportive ? ?Summary: Patient presented for today's session on time and was alert, oriented x5, with  no evidence or self-report of SI/HI or A/V H.  Patient reported ongoing compliance with medication and denied any use of alcohol or illicit substances since last session. Clinician inquired about patient's current emotional ratings, as well as any significant changes in thoughts, feelings or behavior since previous session.  Patient reported scores of 6/10 for depression, 6/10 for anxiety, 2/10 for anger/irritability, and denied any panic attacks. Pt provided updates on work/life balance. Clinician processed with client changes over the previous 2 weeks to support sobriety and build recovery network. Clinician praised client engagement with 12 step community meeting attendance, work with sponsor and step work. "I'm still struggling with my 2nd step." Cln encourage pt to work with her sponsor in understanding step 2. Cln and pt explored her worksheets on Shame in Recovery and Letting Go.  ? ? ?Suicidal/Homicidal: No ? ?Assessment and Plan: Counselor will continue to meet with patient to address treatment plan goals. Patient will continue to follow recommendations of providers and implement skills learned in session. ?  ?Follow Up Instructions: I discussed the assessment and treatment plan with the patient. The patient was provided an opportunity to ask questions and all were answered. The patient agreed with the plan and demonstrated an  understanding of the instructions. ?  ?The patient was advised to call back or seek an in-person evaluation if the symptoms worsen or if the condition fails to improve as anticipated. ? ? ?Collaboration of Care: Pt was referred to NA sponsor for help in  understanding Step 2. ? ?Patient/Guardian was advised Release of Information must be obtained prior to any record release in order to collaborate their care with an outside provider. Patient/Guardian was advised if they  have not already done so to contact the registration department to sign all necessary forms in order for [name] to  release information regarding their care.  ? ?Consent: Patient/Guardian gives verbal consent for treatment and assignment of benefits for services provided during this visit. Patient/Guardian expressed understanding and agreed to proceed.   ? ? ? 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 ? ? ? ?Dorsel Flinn S, LCAS ?11/03/2021 ? ?

## 2021-11-10 ENCOUNTER — Encounter (HOSPITAL_COMMUNITY): Payer: Self-pay | Admitting: Licensed Clinical Social Worker

## 2021-11-10 ENCOUNTER — Ambulatory Visit (INDEPENDENT_AMBULATORY_CARE_PROVIDER_SITE_OTHER): Payer: 59 | Admitting: Licensed Clinical Social Worker

## 2021-11-10 DIAGNOSIS — F1510 Other stimulant abuse, uncomplicated: Secondary | ICD-10-CM

## 2021-11-10 DIAGNOSIS — F1994 Other psychoactive substance use, unspecified with psychoactive substance-induced mood disorder: Secondary | ICD-10-CM

## 2021-11-10 NOTE — Progress Notes (Signed)
Virtual Visit via Video Note ? ?I connected with Maria Pineda on 11/10/21 at 9:30am by a video enabled telemedicine application and verified that I am speaking with the correct person using two identifiers. ? ?Location: ?Patient: home ?Provider: home office ?  ?I discussed the limitations of evaluation and management by telemedicine and the availability of in person appointments. The patient expressed understanding and agreed to proceed. ?  ?I discussed the assessment and treatment plan with the patient. The patient was provided an opportunity to ask questions and all were answered. The patient agreed with the plan and demonstrated an understanding of the instructions. ?  ?The patient was advised to call back or seek an in-person evaluation if the symptoms worsen or if the condition fails to improve as anticipated. ? ?I provided 30 minutes of non-face-to-face time during this encounter. ? ? ?Steffany Schoenfelder S, LCAS ? ? ? ?THERAPIST PROGRESS NOTE ? ?Session Time: 9:15-10:00 am ? ?Participation Level: Active ? ?Behavioral Response: CasualAlert ? ?Type of Therapy: Individual Therapy ? ?Treatment Goals addressed: Pt will meet with clinician in person weekly for therapy to monitor for progress towards goals and address any barriers to success; Reduce depression from average severity level of 6/10 down to a 4/10 in next 90 days by engaging in 1-3 positive coping skills daily as part of developing self-care routine; Reduce average anxiety level from 7/10 down to 5/10 in next 90 days by utilizing 1-3 relaxation skills/grounding skills per day, such as mindful breathing, progressive muscle relaxation, positive visualizations. Pt will remain free from all substances for the next 90 days as evidenced by self-report, meeting attendance, step work, meet with sponsor.  ? ?Progress Towards Goal: Progressing ? ? ?Interventions: Supportive ? ?Summary: Patient presented for today's session on time and was alert, oriented x5, with  no evidence or self-report of SI/HI or A/V H.  Patient reported ongoing compliance with medication and denied any use of alcohol or illicit substances since last session. Clinician inquired about patient's current emotional ratings, as well as any significant changes in thoughts, feelings or behavior since previous session.  Patient reported scores of 6/10 for depression, 6/10 for anxiety, 2/10 for anger/irritability, and denied any panic attacks. Pt provided updates on work/life balance. Clinician processed with client changes over the previous 2 weeks to support sobriety and build recovery network. Clinician praised client engagement with 12 step community meeting attendance, work with sponsor and step work. "I'm still struggling with my 2nd step." Cln encourage pt to work with her sponsor in understanding step 2. Cln and pt explored AA [employer] in [place]. Cln encouraged pt to speak up in meetings about her struggle with step 2. A conversation ensued about the necessity of speaking in meetings. ? ?Suicidal/Homicidal: No ? ?Assessment and Plan: Counselor will continue to meet with patient to address treatment plan goals. Patient will continue to follow recommendations of providers and implement skills learned in session. ?  ?Follow Up Instructions: I discussed the assessment and treatment plan with the patient. The patient was provided an opportunity to ask questions and all were answered. The patient agreed with the plan and demonstrated an  understanding of the instructions. ?  ?The patient was advised to call back or seek an in-person evaluation if the symptoms worsen or if the condition fails to improve as anticipated. ? ? ?Collaboration of Care: Pt was referred to NA sponsor for help in  understanding Step 2 and to speak up in meetings about her struggle with Step 2. ? ?  Patient/Guardian was advised Release of Information must be obtained prior to any record release in order to collaborate their  care with an outside provider. Patient/Guardian was advised if they have not already done so to contact the registration department to sign all necessary forms in order for [name] to release information regarding their care.  ? ?Consent: Patient/Guardian gives verbal consent for treatment and assignment of benefits for services provided during this visit. Patient/Guardian expressed understanding and agreed to proceed.   ? ? ? 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 ? ? ? ?Jordan Pardini S, LCAS ?11/10/2021 ? ?

## 2021-11-17 ENCOUNTER — Ambulatory Visit (INDEPENDENT_AMBULATORY_CARE_PROVIDER_SITE_OTHER): Payer: 59 | Admitting: Licensed Clinical Social Worker

## 2021-11-17 ENCOUNTER — Encounter (HOSPITAL_COMMUNITY): Payer: Self-pay | Admitting: Licensed Clinical Social Worker

## 2021-11-17 DIAGNOSIS — F1994 Other psychoactive substance use, unspecified with psychoactive substance-induced mood disorder: Secondary | ICD-10-CM | POA: Diagnosis not present

## 2021-11-17 DIAGNOSIS — F1510 Other stimulant abuse, uncomplicated: Secondary | ICD-10-CM | POA: Diagnosis not present

## 2021-11-17 NOTE — Progress Notes (Signed)
Virtual Visit via Phone Note ? ?I connected with Maria Pineda on 11/17/21 at 9:30am by a phone enabled telemedicine application and verified that I am speaking with the correct person using two identifiers. ? ?Location: ?Patient: home ?Provider: home office ?  ?I discussed the limitations of evaluation and management by telemedicine and the availability of in person appointments. The patient expressed understanding and agreed to proceed. ?  ?I discussed the assessment and treatment plan with the patient. The patient was provided an opportunity to ask questions and all were answered. The patient agreed with the plan and demonstrated an understanding of the instructions. ?  ?The patient was advised to call back or seek an in-person evaluation if the symptoms worsen or if the condition fails to improve as anticipated. ? ?I provided 30 minutes of non-face-to-face time during this encounter. ? ? ?Yobana Culliton S, LCAS ? ? ? ?THERAPIST PROGRESS NOTE ? ?Session Time: 9:30-10:00 am ? ?Participation Level: Active ? ?Behavioral Response: CasualAlert ? ?Type of Therapy: Individual Therapy ? ?Treatment Goals addressed: Pt will meet with clinician in person weekly for therapy to monitor for progress towards goals and address any barriers to success; Reduce depression from average severity level of 6/10 down to a 4/10 in next 90 days by engaging in 1-3 positive coping skills daily as part of developing self-care routine; Reduce average anxiety level from 7/10 down to 5/10 in next 90 days by utilizing 1-3 relaxation skills/grounding skills per day, such as mindful breathing, progressive muscle relaxation, positive visualizations. Pt will remain free from all substances for the next 90 days as evidenced by self-report, meeting attendance, step work, meet with sponsor.  ? ?Progress Towards Goal: Progressing ? ? ?Interventions: Supportive ? ?Summary: Patient presented for today's session on time and was alert, oriented x5, with  no evidence or self-report of SI/HI or A/V H.  Patient reported ongoing compliance with medication and denied any use of alcohol or illicit substances since last session. Clinician inquired about patient's current emotional ratings, as well as any significant changes in thoughts, feelings or behavior since previous session.  Patient reported scores of 6/10 for depression, 6/10 for anxiety, 2/10 for anger/irritability, and denied any panic attacks. Pt provided updates on work/life balance. Clinician processed with client changes over the previous 2 weeks to support sobriety and build recovery network. Clinician praised client engagement with 12 step community meeting attendance, work with sponsor and step work. "I'm still working on the 2nd step." Again, Cln encourage pt to work with her sponsor in understanding step 2. Pt reports on a struggle she's having with a personal relationship. "I don't feel heard." Cln and pt explored effective communication skills, asking for what she needs in the relationship, not using passive aggressive statements. Cln and pt role played effective communication skills. Due to connection issues, session was only 30 minutes. ? ? ? ?Suicidal/Homicidal: No ? ?Assessment and Plan: Counselor will continue to meet with patient to address treatment plan goals. Patient will continue to follow recommendations of providers and implement skills learned in session. ?  ?Follow Up Instructions: I discussed the assessment and treatment plan with the patient. The patient was provided an opportunity to ask questions and all were answered. The patient agreed with the plan and demonstrated an  understanding of the instructions. ?  ?The patient was advised to call back or seek an in-person evaluation if the symptoms worsen or if the condition fails to improve as anticipated. ? ? ?Collaboration of Care: Pt was referred  to NA sponsor for help in  understanding Step 2 and to speak up in meetings about her  struggle with Step 2. ? ?Patient/Guardian was advised Release of Information must be obtained prior to any record release in order to collaborate their care with an outside provider. Patient/Guardian was advised if they have not already done so to contact the registration department to sign all necessary forms in order for [name] to release information regarding their care.  ? ?Consent: Patient/Guardian gives verbal consent for treatment and assignment of benefits for services provided during this visit. Patient/Guardian expressed understanding and agreed to proceed.   ? ? ? 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 ? ? ? ?Ashlei Chinchilla S, LCAS ?11/17/2021 ? ?

## 2021-11-24 ENCOUNTER — Ambulatory Visit (INDEPENDENT_AMBULATORY_CARE_PROVIDER_SITE_OTHER): Payer: 59 | Admitting: Licensed Clinical Social Worker

## 2021-11-24 DIAGNOSIS — F1994 Other psychoactive substance use, unspecified with psychoactive substance-induced mood disorder: Secondary | ICD-10-CM

## 2021-11-24 DIAGNOSIS — F1510 Other stimulant abuse, uncomplicated: Secondary | ICD-10-CM

## 2021-11-30 ENCOUNTER — Encounter (HOSPITAL_COMMUNITY): Payer: Self-pay | Admitting: Licensed Clinical Social Worker

## 2021-11-30 NOTE — Progress Notes (Signed)
Virtual Visit via Phone Note ? ?I connected with Maria Pineda on 11/24/21 at 9:15am by a phone enabled telemedicine application and verified that I am speaking with the correct person using two identifiers. ? ?Location: ?Patient: home ?Provider: home office ?  ?I discussed the limitations of evaluation and management by telemedicine and the availability of in person appointments. The patient expressed understanding and agreed to proceed. ?  ?I discussed the assessment and treatment plan with the patient. The patient was provided an opportunity to ask questions and all were answered. The patient agreed with the plan and demonstrated an understanding of the instructions. ?  ?The patient was advised to call back or seek an in-person evaluation if the symptoms worsen or if the condition fails to improve as anticipated. ? ?I provided 45 minutes of non-face-to-face time during this encounter. ? ? ?Sheryn Aldaz S, LCAS ? ? ? ?THERAPIST PROGRESS NOTE ? ?Session Time: 9:15-10:00 am ? ?Participation Level: Active ? ?Behavioral Response: CasualAlert ? ?Type of Therapy: Individual Therapy ? ?Treatment Goals addressed: Pt will meet with clinician in person weekly for therapy to monitor for progress towards goals and address any barriers to success; Reduce depression from average severity level of 6/10 down to a 4/10 in next 90 days by engaging in 1-3 positive coping skills daily as part of developing self-care routine; Reduce average anxiety level from 7/10 down to 5/10 in next 90 days by utilizing 1-3 relaxation skills/grounding skills per day, such as mindful breathing, progressive muscle relaxation, positive visualizations. Pt will remain free from all substances for the next 90 days as evidenced by self-report, meeting attendance, step work, meet with sponsor.  ? ?Progress Towards Goal: Progressing ? ? ?Interventions: Supportive ? ?Summary: Patient presented for today's session on time and was alert, oriented x5, with no  evidence or self-report of SI/HI or A/V H.  Patient reported ongoing compliance with medication and denied any use of alcohol or illicit substances since last session. Clinician inquired about patient's current emotional ratings, as well as any significant changes in thoughts, feelings or behavior since previous session.  Patient emotional scores are determined 6/10 for depression, 6/10 for anxiety, 2/10 for anger/irritability, and denied any panic attacks. Pt provided updates on work/life balance. Clinician processed with client changes over the previous 2 weeks to support sobriety and build recovery network. Clinician praised client engagement with 12 step community meeting attendance, work with sponsor and step work. "I'm still working on the 2nd step." Cln and pt discussed complacency and putting off step work, finding alternatives that working steps. Patient reports on the meetings she attends and was encouraged to go to additional meetings. CLn and pt discussed AA vs NA, acceptance in AA when there is no alcohol use. Cln encouraged pt to go on virtual meetings to see if they are more accepting. Cln provided a list of women's meetings in the [place] area. ? ? ? ?Suicidal/Homicidal: No ? ?Assessment and Plan: Counselor will continue to meet with patient to address treatment plan goals. Patient will continue to follow recommendations of providers and implement skills learned in session. ?  ?Follow Up Instructions: I discussed the assessment and treatment plan with the patient. The patient was provided an opportunity to ask questions and all were answered. The patient agreed with the plan and demonstrated an  understanding of the instructions. ?  ?The patient was advised to call back or seek an in-person evaluation if the symptoms worsen or if the condition fails to improve as anticipated. ? ? ?  Collaboration of Care: Pt was given a list of Women's meetings in the community and encouraged to venture out and  attend different meetings, including virtual meetings. ? ?Patient/Guardian was advised Release of Information must be obtained prior to any record release in order to collaborate their care with an outside provider. Patient/Guardian was advised if they have not already done so to contact the registration department to sign all necessary forms in order for [name] to release information regarding their care.  ? ?Consent: Patient/Guardian gives verbal consent for treatment and assignment of benefits for services provided during this visit. Patient/Guardian expressed understanding and agreed to proceed.   ? ? ? 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 ? ? ? ?Naksh Radi S, LCAS ?11/24/21 ? ?

## 2021-12-01 ENCOUNTER — Ambulatory Visit (HOSPITAL_COMMUNITY): Payer: 59 | Admitting: Licensed Clinical Social Worker

## 2021-12-08 ENCOUNTER — Ambulatory Visit (INDEPENDENT_AMBULATORY_CARE_PROVIDER_SITE_OTHER): Payer: 59 | Admitting: Licensed Clinical Social Worker

## 2021-12-08 DIAGNOSIS — F1994 Other psychoactive substance use, unspecified with psychoactive substance-induced mood disorder: Secondary | ICD-10-CM | POA: Diagnosis not present

## 2021-12-08 DIAGNOSIS — F1510 Other stimulant abuse, uncomplicated: Secondary | ICD-10-CM

## 2021-12-15 ENCOUNTER — Ambulatory Visit (INDEPENDENT_AMBULATORY_CARE_PROVIDER_SITE_OTHER): Payer: 59 | Admitting: Licensed Clinical Social Worker

## 2021-12-15 ENCOUNTER — Encounter (HOSPITAL_COMMUNITY): Payer: Self-pay | Admitting: Licensed Clinical Social Worker

## 2021-12-15 DIAGNOSIS — F1994 Other psychoactive substance use, unspecified with psychoactive substance-induced mood disorder: Secondary | ICD-10-CM | POA: Diagnosis not present

## 2021-12-15 DIAGNOSIS — F1510 Other stimulant abuse, uncomplicated: Secondary | ICD-10-CM

## 2021-12-15 NOTE — Progress Notes (Signed)
Virtual Visit via video Note ? ?I connected with Ashara Portz on 12/15/21 at 9:15am by a video enabled telemedicine application and verified that I am speaking with the correct person using two identifiers. ? ?Location: ?Patient: home ?Provider: home office ?  ?I discussed the limitations of evaluation and management by telemedicine and the availability of in person appointments. The patient expressed understanding and agreed to proceed. ?  ?I discussed the assessment and treatment plan with the patient. The patient was provided an opportunity to ask questions and all were answered. The patient agreed with the plan and demonstrated an understanding of the instructions. ?  ?The patient was advised to call back or seek an in-person evaluation if the symptoms worsen or if the condition fails to improve as anticipated. ? ?I provided 45 minutes of non-face-to-face time during this encounter. ? ? ?Taraann Olthoff S, LCAS ? ? ? ?THERAPIST PROGRESS NOTE ? ?Session Time: 9:15-10:00 am ? ?Participation Level: Active ? ?Behavioral Response: CasualAlert ? ?Type of Therapy: Individual Therapy ? ?Treatment Goals addressed: Pt will meet with clinician in person weekly for therapy to monitor for progress towards goals and address any barriers to success; Reduce depression from average severity level of 6/10 down to a 4/10 in next 90 days by engaging in 1-3 positive coping skills daily as part of developing self-care routine; Reduce average anxiety level from 7/10 down to 5/10 in next 90 days by utilizing 1-3 relaxation skills/grounding skills per day, such as mindful breathing, progressive muscle relaxation, positive visualizations. Pt will remain free from all substances for the next 90 days as evidenced by self-report, meeting attendance, step work, meet with sponsor.  ? ?Progress Towards Goal: Progressing ? ? ?Interventions: Supportive ? ?Summary: Patient presented for today's session on time and was alert, oriented x5, with no  evidence or self-report of SI/HI or A/V H.  Patient reported ongoing compliance with medication and denied any use of alcohol or illicit substances since last session. Clinician inquired about patient's current emotional ratings, as well as any significant changes in thoughts, feelings or behavior since previous session.  Patient emotional scores determined 6/10 for depression, 6/10 for anxiety, 2/10 for anger/irritability, and denied any panic attacks. Pt provided updates on work/life balance. "Pt discussed her feelings surrounding Mother's Day, not hearing from her sons, not having any relationship with her sons." ?Clinician utilized MI OARS to reflect and summarize thoughts and feelings.Clinician processed with client changes over the previous week to support sobriety and build recovery network. Clinician praised client engagement with 12 step community meeting attendance, work with sponsor and step work. "I finished my 2nd step with my sponsor."  Cln and pt explored step 3, "This is also going to be a difficult step for me as well." Cln and pt explored her history in religion.  Patient reports on the meetings she attends and was encouraged to go to additional meetings. ? ? ? ?Suicidal/Homicidal: No ? ?Assessment and Plan: Counselor will continue to meet with patient to address treatment plan goals. Patient will continue to follow recommendations of providers and implement skills learned in session. ?  ?Follow Up Instructions: I discussed the assessment and treatment plan with the patient. The patient was provided an opportunity to ask questions and all were answered. The patient agreed with the plan and demonstrated an  understanding of the instructions. ?  ?The patient was advised to call back or seek an in-person evaluation if the symptoms worsen or if the condition fails to improve as anticipated. ? ? ?  Collaboration of Care: Pt was given a list of Women's meetings in the community and encouraged to venture  out and attend different meetings, including virtual meetings. ? ?Patient/Guardian was advised Release of Information must be obtained prior to any record release in order to collaborate their care with an outside provider. Patient/Guardian was advised if they have not already done so to contact the registration department to sign all necessary forms in order for [name] to release information regarding their care.  ? ?Consent: Patient/Guardian gives verbal consent for treatment and assignment of benefits for services provided during this visit. Patient/Guardian expressed understanding and agreed to proceed.   ? ? ? 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 ? ? ? ?Mariaeduarda Defranco S, LCAS ?11/24/21 ? ? ?

## 2021-12-22 ENCOUNTER — Ambulatory Visit (HOSPITAL_COMMUNITY): Payer: 59 | Admitting: Licensed Clinical Social Worker

## 2021-12-29 ENCOUNTER — Ambulatory Visit (HOSPITAL_COMMUNITY): Payer: 59 | Admitting: Licensed Clinical Social Worker

## 2022-01-05 ENCOUNTER — Ambulatory Visit (INDEPENDENT_AMBULATORY_CARE_PROVIDER_SITE_OTHER): Payer: 59 | Admitting: Licensed Clinical Social Worker

## 2022-01-05 ENCOUNTER — Encounter (HOSPITAL_COMMUNITY): Payer: Self-pay | Admitting: Licensed Clinical Social Worker

## 2022-01-05 DIAGNOSIS — F1994 Other psychoactive substance use, unspecified with psychoactive substance-induced mood disorder: Secondary | ICD-10-CM | POA: Diagnosis not present

## 2022-01-05 DIAGNOSIS — F1510 Other stimulant abuse, uncomplicated: Secondary | ICD-10-CM

## 2022-01-12 ENCOUNTER — Encounter (HOSPITAL_COMMUNITY): Payer: Self-pay | Admitting: Licensed Clinical Social Worker

## 2022-01-12 ENCOUNTER — Ambulatory Visit (INDEPENDENT_AMBULATORY_CARE_PROVIDER_SITE_OTHER): Payer: 59 | Admitting: Licensed Clinical Social Worker

## 2022-01-12 DIAGNOSIS — F1994 Other psychoactive substance use, unspecified with psychoactive substance-induced mood disorder: Secondary | ICD-10-CM

## 2022-01-12 DIAGNOSIS — F1510 Other stimulant abuse, uncomplicated: Secondary | ICD-10-CM

## 2022-01-12 NOTE — Progress Notes (Deleted)
Virtual Visit via video Note  I connected with Vanette Noguchi on 01/12/22 at 9:15am by a video enabled telemedicine application and verified that I am speaking with the correct person using two identifiers.  Location: Patient: home Provider: home office   I discussed the limitations of evaluation and management by telemedicine and the availability of in person appointments. The patient expressed understanding and agreed to proceed.   I discussed the assessment and treatment plan with the patient. The patient was provided an opportunity to ask questions and all were answered. The patient agreed with the plan and demonstrated an understanding of the instructions.   The patient was advised to call back or seek an in-person evaluation if the symptoms worsen or if the condition fails to improve as anticipated.  I provided 45 minutes of non-face-to-face time during this encounter.   Galileah Piggee S, LCAS    THERAPIST PROGRESS NOTE  Session Time: 9:15-10:00 am  Participation Level: Active  Behavioral Response: CasualAlert  Type of Therapy: Individual Therapy  Treatment Goals addressed: Pt will meet with clinician in person weekly for therapy to monitor for progress towards goals and address any barriers to success; Reduce depression from average severity level of 6/10 down to a 4/10 in next 90 days by engaging in 1-3 positive coping skills daily as part of developing self-care routine; Reduce average anxiety level from 7/10 down to 5/10 in next 90 days by utilizing 1-3 relaxation skills/grounding skills per day, such as mindful breathing, progressive muscle relaxation, positive visualizations. Pt will remain free from all substances for the next 90 days as evidenced by self-report, meeting attendance, step work, meet with sponsor.   Progress Towards Goal: Progressing   Interventions: Supportive  Summary: Patient presented for today's session on time and was alert, oriented x5, with no  evidence or self-report of SI/HI or A/V H.  Patient reported ongoing compliance with medication and denied any use of alcohol or illicit substances since last session. Clinician inquired about patient's current emotional ratings, as well as any significant changes in thoughts, feelings or behavior since previous session.  Patient emotional scores determined 8/10 for depression, 8/10 for anxiety, 2/10 for anger/irritability, and denied any panic attacks. Pt provided updates on work/life balance. 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 They are going to charge me    "Pt discussed her feelings surrounding Mother's Day, not hearing from her sons, not having any relationship with her sons." Clinician utilized MI OARS to reflect and summarize thoughts and feelings.Clinician processed with client changes over the previous week to support sobriety and build recovery network. Clinician praised client engagement with 12 step community meeting attendance, work with sponsor and step work. "I finished my 2nd step with my sponsor."  Cln and pt explored step 3, "This is also going to be a difficult step for me as well." Cln and pt explored her history in religion.  Patient reports on the meetings she attends and was encouraged to go to additional meetings.    Suicidal/Homicidal: No  Assessment and Plan: Counselor will continue to meet with patient to address treatment plan goals. Patient will continue to follow recommendations of providers and implement skills learned in session.   Follow Up Instructions: I discussed the assessment and treatment plan with the patient. The patient was provided an opportunity to ask questions and all were answered. The patient agreed with the plan and demonstrated an  understanding of the instructions.  The patient was advised to call back or seek an in-person evaluation if the symptoms worsen or if the condition fails to improve as  anticipated.   Collaboration of Care: Pt was given a list of Women's meetings in the community and encouraged to venture out and attend different meetings, including virtual meetings.  Patient/Guardian was advised Release of Information must be obtained prior to any record release in order to collaborate their care with an outside provider. Patient/Guardian was advised if they have not already done so to contact the registration department to sign all necessary forms in order for [name] to release information regarding their care.   Consent: Patient/Guardian gives verbal consent for treatment and assignment of benefits for services provided during this visit. Patient/Guardian expressed understanding and agreed to proceed.      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

## 2022-01-12 NOTE — Progress Notes (Signed)
Virtual Visit via video Note  I connected with Maria Pineda on 01/05/22 at 9:15am by a video enabled telemedicine application and verified that I am speaking with the correct person using two identifiers.  Location: Patient: home Provider: home office   I discussed the limitations of evaluation and management by telemedicine and the availability of in person appointments. The patient expressed understanding and agreed to proceed.   I discussed the assessment and treatment plan with the patient. The patient was provided an opportunity to ask questions and all were answered. The patient agreed with the plan and demonstrated an understanding of the instructions.   The patient was advised to call back or seek an in-person evaluation if the symptoms worsen or if the condition fails to improve as anticipated.  I provided 45 minutes of non-face-to-face time during this encounter.   Argel Pablo S, LCAS    THERAPIST PROGRESS NOTE  Session Time: 9:15-10:00 am  Participation Level: Active  Behavioral Response: CasualAlert  Type of Therapy: Individual Therapy  Treatment Goals addressed: Pt will meet with clinician in person weekly for therapy to monitor for progress towards goals and address any barriers to success; Reduce depression from average severity level of 6/10 down to a 4/10 in next 90 days by engaging in 1-3 positive coping skills daily as part of developing self-care routine; Reduce average anxiety level from 7/10 down to 5/10 in next 90 days by utilizing 1-3 relaxation skills/grounding skills per day, such as mindful breathing, progressive muscle relaxation, positive visualizations. Pt will remain free from all substances for the next 90 days as evidenced by self-report, meeting attendance, step work, meet with sponsor.   Progress Towards Goal: Progressing   Interventions: Supportive  Summary: Patient presented for today's session on time and was alert, oriented x5, with no  evidence or self-report of SI/HI or A/V H.  Patient reported ongoing compliance with medication and denied any use of alcohol or illicit substances since last session. Clinician inquired about patient's current emotional ratings, as well as any significant changes in thoughts, feelings or behavior since previous session.  Patient emotional scores determined 8/10 for depression,8/10 for anxiety, 2/10 for anger/irritability, and denied any panic attacks. Pt provided updates on work/life balance. "I have something to share, though, I don't really want to: I've been suspended from my job for returning a purchase on my own work station. They are going to investigate it and possibly charge me." Cln asked open ended questions. Clinician utilized [place] OARS to reflect and summarize thoughts and feelings. Cln suggested pt talk to her sponsor about the incident for support. Clinician processed with client changes over the previous week to support sobriety and build recovery network. Clinician praised client engagement with 12 step community meeting attendance, work with sponsor and step work.  Patient reports on the meetings she attended and was encouraged to go to additional meetings, even if virtual.     Suicidal/Homicidal: No  Assessment and Plan: Counselor will continue to meet with patient to address treatment plan goals. Patient will continue to follow recommendations of providers and implement skills learned in session.   Follow Up Instructions: I discussed the assessment and treatment plan with the patient. The patient was provided an opportunity to ask questions and all were answered. The patient agreed with the plan and demonstrated an  understanding of the instructions.   The patient was advised to call back or seek an in-person evaluation if the symptoms worsen or if the condition fails to improve  as anticipated.   Collaboration of Care: Other: None used at this session.  Patient/Guardian was advised  Release of Information must be obtained prior to any record release in order to collaborate their care with an outside provider. Patient/Guardian was advised if they have not already done so to contact the registration department to sign all necessary forms in order for [name] to release information regarding their care.   Consent: Patient/Guardian gives verbal consent for treatment and assignment of benefits for services provided during this visit. Patient/Guardian expressed understanding and agreed to proceed.      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   Dylan Ruotolo S, LCAS 01/05/22

## 2022-01-19 ENCOUNTER — Encounter (HOSPITAL_COMMUNITY): Payer: Self-pay | Admitting: Licensed Clinical Social Worker

## 2022-01-19 ENCOUNTER — Ambulatory Visit (INDEPENDENT_AMBULATORY_CARE_PROVIDER_SITE_OTHER): Payer: 59 | Admitting: Licensed Clinical Social Worker

## 2022-01-19 DIAGNOSIS — F1510 Other stimulant abuse, uncomplicated: Secondary | ICD-10-CM | POA: Diagnosis not present

## 2022-01-19 DIAGNOSIS — F1994 Other psychoactive substance use, unspecified with psychoactive substance-induced mood disorder: Secondary | ICD-10-CM | POA: Diagnosis not present

## 2022-01-19 NOTE — Progress Notes (Signed)
Virtual Visit via video Note  I connected with Jeffrey Voth on 01/12/22 at 9:15am by a video enabled telemedicine application and verified that I am speaking with the correct person using two identifiers.  Location: Patient: home Provider: home office   I discussed the limitations of evaluation and management by telemedicine and the availability of in person appointments. The patient expressed understanding and agreed to proceed.   I discussed the assessment and treatment plan with the patient. The patient was provided an opportunity to ask questions and all were answered. The patient agreed with the plan and demonstrated an understanding of the instructions.   The patient was advised to call back or seek an in-person evaluation if the symptoms worsen or if the condition fails to improve as anticipated.  I provided 45 minutes of non-face-to-face time during this encounter.   Montrey Buist S, LCAS    THERAPIST PROGRESS NOTE  Session Time: 9:15-10:00 am  Participation Level: Active  Behavioral Response: CasualAlert  Type of Therapy: Individual Therapy  Treatment Goals addressed: Pt will meet with clinician in person weekly for therapy to monitor for progress towards goals and address any barriers to success; Reduce depression from average severity level of 6/10 down to a 4/10 in next 90 days by engaging in 1-3 positive coping skills daily as part of developing self-care routine; Reduce average anxiety level from 7/10 down to 5/10 in next 90 days by utilizing 1-3 relaxation skills/grounding skills per day, such as mindful breathing, progressive muscle relaxation, positive visualizations. Pt will remain free from all substances for the next 90 days as evidenced by self-report, meeting attendance, step work, meet with sponsor.   Progress Towards Goal: Progressing   Interventions: Supportive  Summary: Patient presented for today's session on time and was alert, oriented x5, with no  evidence or self-report of SI/HI or A/V H.  Patient reported ongoing compliance with medication and denied any use of alcohol or illicit substances since last session. Clinician inquired about patient's current emotional ratings, as well as any significant changes in thoughts, feelings or behavior since previous session.  Patient emotional scores determined 8/10 for depression, 8/10 for anxiety, 2/10 for anger/irritability, and denied any panic attacks. Pt provided updates on work/life balance. "I have been terminated from my job and they are threatening to charge me with theft by employee. 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 Clinician processed thoughts, feelings, and behaviors. Clinician discussed coping skills and options for supporting herself through these challenging times.Clinician processed with client changes over the previous week to support sobriety and build a recovery network. Clinician praised client engagement with 12 step community meeting attendance, work with sponsor and step work.  Patient reports on the meetings she attended. "I'm going to more meetings.  Im still working on Step 3, finding my higher power.    Suicidal/Homicidal: No  Assessment and Plan: Counselor will continue to meet with patient to address treatment plan goals. Patient will continue to follow recommendations of providers and implement skills learned in session.   Follow Up Instructions: I discussed the assessment and treatment plan with the patient. The patient was provided an opportunity to ask questions and all were answered. The patient agreed with the plan and demonstrated an  understanding of the instructions.   The patient was advised to call back or seek an in-person evaluation if the symptoms worsen or if the condition fails to improve as anticipated.   Collaboration of Care: Other:  None used at this session.  Patient/Guardian was advised Release of  Information must be obtained prior to any record release in order to collaborate their care with an outside provider. Patient/Guardian was advised if they have not already done so to contact the registration department to sign all necessary forms in order for [name] to release information regarding their care.   Consent: Patient/Guardian gives verbal consent for treatment and assignment of benefits for services provided during this visit. Patient/Guardian expressed understanding and agreed to proceed.      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   Florrie Ramires S, LCAS 01/12/22

## 2022-01-19 NOTE — Progress Notes (Signed)
Virtual Visit via video Note  I connected with Maria Pineda on 01/19/22 at 9:15am by a video enabled telemedicine application and verified that I am speaking with the correct person using two identifiers.  Location: Patient: home Provider: home office   I discussed the limitations of evaluation and management by telemedicine and the availability of in person appointments. The patient expressed understanding and agreed to proceed.   I discussed the assessment and treatment plan with the patient. The patient was provided an opportunity to ask questions and all were answered. The patient agreed with the plan and demonstrated an understanding of the instructions.   The patient was advised to call back or seek an in-person evaluation if the symptoms worsen or if the condition fails to improve as anticipated.  I provided 45 minutes of non-face-to-face time during this encounter.   Jacquie Lukes S, LCAS    THERAPIST PROGRESS NOTE  Session Time: 9:15-10:00 am  Participation Level: Active  Behavioral Response: CasualAlert  Type of Therapy: Individual Therapy  Treatment Goals addressed: Pt will meet with clinician in person weekly for therapy to monitor for progress towards goals and address any barriers to success; Reduce depression from average severity level of 6/10 down to a 4/10 in next 90 days by engaging in 1-3 positive coping skills daily as part of developing self-care routine; Reduce average anxiety level from 7/10 down to 5/10 in next 90 days by utilizing 1-3 relaxation skills/grounding skills per day, such as mindful breathing, progressive muscle relaxation, positive visualizations. Pt will remain free from all substances for the next 90 days as evidenced by self-report, meeting attendance, step work, meet with sponsor.   Progress Towards Goal: Progressing   Interventions: Supportive  Summary: Patient presented for today's session on time and was alert, oriented x5, with no  evidence or self-report of SI/HI or A/V H.  Patient reported ongoing compliance with medication and denied any use of alcohol or illicit substances since last session. Clinician inquired about patient's current emotional ratings, as well as any significant changes in thoughts, feelings or behavior since previous session.  Patient emotional scores determined 8/10 for depression, 8/10 for anxiety, 2/10 for anger/irritability, and denied any panic attacks. Pt provided updates on work/life balance. "I have had 2 interviews at [facility] and [employer]." Cln asked open ended questions. Pt reports, "I've talked to my attorney and he is monitoring my criminal case." Clinician utilized CBT to process concerns and challenges. Clinician processed thoughts, feelings, and behaviors. Clinician discussed coping skills and options for supporting herself through these challenging times. Clinician processed with client changes over the previous week to support sobriety and build a recovery network. Clinician praised client engagement with 12 step community meeting attendance, work with sponsor and step work.  Patient reports on the meetings she attended. "I'm going to more meetings, 5 last week. I finished Step 3, finding my higher power!" Cln asked open ended questions about higher power.    Suicidal/Homicidal: No  Assessment and Plan: Counselor will continue to meet with patient to address treatment plan goals. Patient will continue to follow recommendations of providers and implement skills learned in session.   Follow Up Instructions: I discussed the assessment and treatment plan with the patient. The patient was provided an opportunity to ask questions and all were answered. The patient agreed with the plan and demonstrated an  understanding of the instructions.   The patient was advised to call back or seek an in-person evaluation if the symptoms worsen or  if the condition fails to improve as  anticipated.   Collaboration of Care: Other: None used at this session.  Patient/Guardian was advised Release of Information must be obtained prior to any record release in order to collaborate their care with an outside provider. Patient/Guardian was advised if they have not already done so to contact the registration department to sign all necessary forms in order for [name] to release information regarding their care.   Consent: Patient/Guardian gives verbal consent for treatment and assignment of benefits for services provided during this visit. Patient/Guardian expressed understanding and agreed to proceed.      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   Chania Kochanski S, LCAS 01/19/22

## 2022-01-26 ENCOUNTER — Ambulatory Visit (INDEPENDENT_AMBULATORY_CARE_PROVIDER_SITE_OTHER): Payer: 59 | Admitting: Licensed Clinical Social Worker

## 2022-01-26 ENCOUNTER — Encounter (HOSPITAL_COMMUNITY): Payer: Self-pay | Admitting: Licensed Clinical Social Worker

## 2022-01-26 DIAGNOSIS — F1994 Other psychoactive substance use, unspecified with psychoactive substance-induced mood disorder: Secondary | ICD-10-CM

## 2022-01-26 DIAGNOSIS — F1510 Other stimulant abuse, uncomplicated: Secondary | ICD-10-CM | POA: Diagnosis not present

## 2022-01-26 NOTE — Progress Notes (Signed)
Virtual Visit via video Note  I connected with Maria Pineda on 01/26/22 at 9:15am by a video enabled telemedicine application and verified that I am speaking with the correct person using two identifiers.  Location: Patient: home Provider: home office   I discussed the limitations of evaluation and management by telemedicine and the availability of in person appointments. The patient expressed understanding and agreed to proceed.   I discussed the assessment and treatment plan with the patient. The patient was provided an opportunity to ask questions and all were answered. The patient agreed with the plan and demonstrated an understanding of the instructions.   The patient was advised to call back or seek an in-person evaluation if the symptoms worsen or if the condition fails to improve as anticipated.  I provided 45 minutes of non-face-to-face time during this encounter.   Meggen Spaziani S, LCAS    THERAPIST PROGRESS NOTE  Session Time: 9:15-10:00 am  Participation Level: Active  Behavioral Response: CasualAlert  Type of Therapy: Individual Therapy  Treatment Goals addressed: Pt will meet with clinician in person weekly for therapy to monitor for progress towards goals and address any barriers to success; Reduce depression from average severity level of 6/10 down to a 4/10 in next 90 days by engaging in 1-3 positive coping skills daily as part of developing self-care routine; Reduce average anxiety level from 7/10 down to 5/10 in next 90 days by utilizing 1-3 relaxation skills/grounding skills per day, such as mindful breathing, progressive muscle relaxation, positive visualizations. Pt will remain free from all substances for the next 90 days as evidenced by self-report, meeting attendance, step work, meet with sponsor.   Progress Towards Goal: Progressing   Interventions: Supportive  Summary: Patient presented for today's session on time and was alert, oriented x5, with no  evidence or self-report of SI/HI or A/V H.  Patient reported ongoing compliance with medication and denied any use of alcohol or illicit substances since last session. Clinician inquired about patient's current emotional ratings, as well as any significant changes in thoughts, feelings or behavior since previous session.  Patient emotional ratings determined 8/10 for depression, 8/10 for anxiety, 2/10 for anger/irritability, and denied any panic attacks. Pt provided updates on work/life balance. "I started at [facility] and [employer] in pending." Cln asked open ended questions. Pt reports, "My attorney continues to  monitor my criminal case." Clinician utilized CBT to process concerns and challenges. Clinician processed thoughts, feelings, and behaviors. Clinician discussed coping skills and options for supporting herself through these challenging times. Clinician processed with client changes over the previous week to support sobriety and build a recovery network. Clinician praised client engagement with 12 step community meeting attendance, work with sponsor and step work.  Patient reports on the meetings she attended. "I'm going to more meetings. I thought I had finished Step 3, finding my higher power, but I'm getting closer to accepting a higher power.Cln asked open ended questions about higher power.    Suicidal/Homicidal: No  Assessment and Plan: Counselor will continue to meet with patient to address treatment plan goals. Patient will continue to follow recommendations of providers and implement skills learned in session.   Follow Up Instructions: I discussed the assessment and treatment plan with the patient. The patient was provided an opportunity to ask questions and all were answered. The patient agreed with the plan and demonstrated an  understanding of the instructions.   The patient was advised to call back or seek an in-person evaluation if  the symptoms worsen or if the condition fails  to improve as anticipated.   Collaboration of Care: Other: None used at this session.  Patient/Guardian was advised Release of Information must be obtained prior to any record release in order to collaborate their care with an outside provider. Patient/Guardian was advised if they have not already done so to contact the registration department to sign all necessary forms in order for [name] to release information regarding their care.   Consent: Patient/Guardian gives verbal consent for treatment and assignment of benefits for services provided during this visit. Patient/Guardian expressed understanding and agreed to proceed.      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   Reed Eifert S, LCAS 01/26/22

## 2022-02-02 ENCOUNTER — Ambulatory Visit (INDEPENDENT_AMBULATORY_CARE_PROVIDER_SITE_OTHER): Payer: 59 | Admitting: Licensed Clinical Social Worker

## 2022-02-02 ENCOUNTER — Encounter (HOSPITAL_COMMUNITY): Payer: Self-pay | Admitting: Licensed Clinical Social Worker

## 2022-02-02 DIAGNOSIS — F1994 Other psychoactive substance use, unspecified with psychoactive substance-induced mood disorder: Secondary | ICD-10-CM

## 2022-02-02 DIAGNOSIS — F1510 Other stimulant abuse, uncomplicated: Secondary | ICD-10-CM | POA: Diagnosis not present

## 2022-02-02 NOTE — Progress Notes (Signed)
Virtual Visit via video Note  I connected with Maria Pineda on 02/02/22 at 9:15am by a video enabled telemedicine application and verified that I am speaking with the correct person using two identifiers.  Location: Patient: home Provider: home office   I discussed the limitations of evaluation and management by telemedicine and the availability of in person appointments. The patient expressed understanding and agreed to proceed.   I discussed the assessment and treatment plan with the patient. The patient was provided an opportunity to ask questions and all were answered. The patient agreed with the plan and demonstrated an understanding of the instructions.   The patient was advised to call back or seek an in-person evaluation if the symptoms worsen or if the condition fails to improve as anticipated.  I provided 45 minutes of non-face-to-face time during this encounter.   Hoke Baer S, LCAS    THERAPIST PROGRESS NOTE  Session Time: 9:15-10:00 am  Participation Level: Active  Behavioral Response: CasualAlert  Type of Therapy: Individual Therapy  Treatment Goals addressed: Pt will meet with clinician in person weekly for therapy to monitor for progress towards goals and address any barriers to success; Reduce depression from average severity level of 6/10 down to a 4/10 in next 90 days by engaging in 1-3 positive coping skills daily as part of developing self-care routine; Reduce average anxiety level from 7/10 down to 5/10 in next 90 days by utilizing 1-3 relaxation skills/grounding skills per day, such as mindful breathing, progressive muscle relaxation, positive visualizations. Pt will remain free from all substances for the next 90 days as evidenced by self-report, meeting attendance, step work, meet with sponsor.   Progress Towards Goal: Progressing   Interventions: Supportive  Summary: Patient presented for today's session on time and was alert, oriented x5, with no  evidence or self-report of SI/HI or A/V H.  Patient reported ongoing compliance with medication and denied any use of alcohol or illicit substances since last session. Clinician inquired about patient's current emotional ratings, as well as any significant changes in thoughts, feelings or behavior since previous session.  Patient emotional ratings determined 6/10 for depression, 8/10 for anxiety, 2/10 for anger/irritability, and denied any panic attacks. Pt provided updates on work/life balance. "I'm working at [employer] and [employer] is still pending." Cln asked open ended questions. Pt reports, "My attorney reports that I'm going to be charged with Misdemeanor Larceny." Clinician utilized CBT to process concerns and challenges. Clinician processed thoughts, feelings, and behaviors. Clinician discussed coping skills and options for supporting herself through these challenging times. Clinician processed with client changes over the previous week to support sobriety and build a recovery network. Clinician praised client engagement with 12 step community meeting attendance, work with sponsor and step work.  Patient reports on the meetings she attended. "I'm going to more meetings. I finished Step 3, finding my higher power.Cln asked open ended questions about finding her higher power.    Suicidal/Homicidal: No  Assessment and Plan: Counselor will continue to meet with patient to address treatment plan goals. Patient will continue to follow recommendations of providers and implement skills learned in session.   Follow Up Instructions: I discussed the assessment and treatment plan with the patient. The patient was provided an opportunity to ask questions and all were answered. The patient agreed with the plan and demonstrated an  understanding of the instructions.   The patient was advised to call back or seek an in-person evaluation if the symptoms worsen or if the  condition fails to improve as  anticipated.   Collaboration of Care: Other: Reach out to [facility] for classes and info on Peer Support Specialist  Patient/Guardian was advised Release of Information must be obtained prior to any record release in order to collaborate their care with an outside provider. Patient/Guardian was advised if they have not already done so to contact the registration department to sign all necessary forms in order for [name] to release information regarding their care.   Consent: Patient/Guardian gives verbal consent for treatment and assignment of benefits for services provided during this visit. Patient/Guardian expressed understanding and agreed to proceed.      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   Jaxston Chohan S, LCAS 02/02/22

## 2022-02-09 ENCOUNTER — Encounter (HOSPITAL_COMMUNITY): Payer: Self-pay | Admitting: Licensed Clinical Social Worker

## 2022-02-09 ENCOUNTER — Ambulatory Visit (INDEPENDENT_AMBULATORY_CARE_PROVIDER_SITE_OTHER): Payer: 59 | Admitting: Licensed Clinical Social Worker

## 2022-02-09 DIAGNOSIS — F1510 Other stimulant abuse, uncomplicated: Secondary | ICD-10-CM

## 2022-02-09 DIAGNOSIS — F1994 Other psychoactive substance use, unspecified with psychoactive substance-induced mood disorder: Secondary | ICD-10-CM | POA: Diagnosis not present

## 2022-02-09 NOTE — Progress Notes (Signed)
Virtual Visit via video Note  I connected with Maria Pineda on 02/09/22 at 9:15am by a video enabled telemedicine application and verified that I am speaking with the correct person using two identifiers.  Location: Patient: home Provider: home office   I discussed the limitations of evaluation and management by telemedicine and the availability of in person appointments. The patient expressed understanding and agreed to proceed.   I discussed the assessment and treatment plan with the patient. The patient was provided an opportunity to ask questions and all were answered. The patient agreed with the plan and demonstrated an understanding of the instructions.   The patient was advised to call back or seek an in-person evaluation if the symptoms worsen or if the condition fails to improve as anticipated.  I provided 45 minutes of non-face-to-face time during this encounter.   Maria Pineda, LCAS    THERAPIST PROGRESS NOTE  Session Time: 9:15-10:00 am  Participation Level: Active  Behavioral Response: CasualAlert  Type of Therapy: Individual Therapy  Treatment Goals addressed: Pt will meet with clinician in person weekly for therapy to monitor for progress towards goals and address any barriers to success; Reduce depression from average severity level of 6/10 down to a 4/10 in next 90 days by engaging in 1-3 positive coping skills daily as part of developing self-care routine; Reduce average anxiety level from 7/10 down to 5/10 in next 90 days by utilizing 1-3 relaxation skills/grounding skills per day, such as mindful breathing, progressive muscle relaxation, positive visualizations. Pt will remain free from all substances for the next 90 days as evidenced by self-report, meeting attendance, step work, meet with sponsor.   Progress Towards Goal: Progressing   Interventions: Supportive  Summary: Patient presented for today'Pineda session on time and was alert, oriented x5, with no  evidence or self-report of SI/HI or A/V H.  Patient reported ongoing compliance with medication and denied any use of alcohol or illicit substances since last session. Clinician inquired about patient'Pineda current emotional ratings, as well as any significant changes in thoughts, feelings or behavior since previous session.  Patient emotional ratings determined 6/10 for depression, 8/10 for anxiety, 2/10 for anger/irritability, and denied any panic attacks. Pt provided updates on work/life balance: job, pending charges (2 [employer], [employer]ellery by employee). Pt goes to [facility] for medication management, at last visit Prozac was increased to 40 mg.  Clinician utilized CBT to process concerns and challenges. Clinician processed thoughts, feelings, and behaviors. Clinician discussed coping skills and options for supporting herself through these challenging times. Clinician processed with client changes over the previous week to support sobriety and build a recovery network. Clinician praised client engagement with 12 step community meeting attendance, work with sponsor and step work.  Patient reports on the meetings she attended, step work and working with sponsor.    Suicidal/Homicidal: No  Assessment and Plan: Counselor will continue to meet with patient to address treatment plan goals. Patient will continue to follow recommendations of providers and implement skills learned in session.   Follow Up Instructions: I discussed the assessment and treatment plan with the patient. The patient was provided an opportunity to ask questions and all were answered. The patient agreed with the plan and demonstrated an  understanding of the instructions.   The patient was advised to call back or seek an in-person evaluation if the symptoms worsen or if the condition fails to improve as anticipated.   Collaboration of Care: Other: Reach out to [facility] for classes and  info on Peer Support Specialist  Patient/Guardian was  advised Release of Information must be obtained prior to any record release in order to collaborate their care with an outside provider. Patient/Guardian was advised if they have not already done so to contact the registration department to sign all necessary forms in order for [name] to release information regarding their care.   Consent: Patient/Guardian gives verbal consent for treatment and assignment of benefits for services provided during this visit. Patient/Guardian expressed understanding and agreed to proceed.      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   Maria Pineda, LCAS 02/09/22

## 2022-02-16 ENCOUNTER — Encounter (HOSPITAL_COMMUNITY): Payer: Self-pay | Admitting: Licensed Clinical Social Worker

## 2022-02-16 ENCOUNTER — Ambulatory Visit (INDEPENDENT_AMBULATORY_CARE_PROVIDER_SITE_OTHER): Payer: 59 | Admitting: Licensed Clinical Social Worker

## 2022-02-16 DIAGNOSIS — F1994 Other psychoactive substance use, unspecified with psychoactive substance-induced mood disorder: Secondary | ICD-10-CM

## 2022-02-16 DIAGNOSIS — F1510 Other stimulant abuse, uncomplicated: Secondary | ICD-10-CM

## 2022-02-16 NOTE — Progress Notes (Addendum)
Virtual Visit via video Note  I connected with Nakeesha Bowler on 02/16/22 at 9:15am by a video enabled telemedicine application and verified that I am speaking with the correct person using two identifiers.  Location: Patient: home Provider: home office   I discussed the limitations of evaluation and management by telemedicine and the availability of in person appointments. The patient expressed understanding and agreed to proceed.   I discussed the assessment and treatment plan with the patient. The patient was provided an opportunity to ask questions and all were answered. The patient agreed with the plan and demonstrated an understanding of the instructions.   The patient was advised to call back or seek an in-person evaluation if the symptoms worsen or if the condition fails to improve as anticipated.  I provided 45 minutes of non-face-to-face time during this encounter.   Asa Baudoin S, LCAS    THERAPIST PROGRESS NOTE  Session Time: 9:15-10:00 am  Participation Level: Active  Behavioral Response: CasualAlert  Type of Therapy: Individual Therapy  Treatment Goals addressed: Pt will meet with clinician in person weekly for therapy to monitor for progress towards goals and address any barriers to success; Reduce depression from average severity level of 6/10 down to a 4/10 in next 90 days by engaging in 1-3 positive coping skills daily as part of developing self-care routine; Reduce average anxiety level from 7/10 down to 5/10 in next 90 days by utilizing 1-3 relaxation skills/grounding skills per day, such as mindful breathing, progressive muscle relaxation, positive visualizations. Pt will remain free from all substances for the next 90 days as evidenced by self-report, meeting attendance, step work, meet with sponsor.   Progress Towards Goal: Progressing   Interventions: Supportive  Summary: Patient presented for today's session on time and was alert, oriented x5, with no  evidence or self-report of SI/HI or A/V H.  Patient reported ongoing compliance with medication and denied any use of alcohol or illicit substances since last session. Clinician inquired about patient's current emotional ratings, as well as any significant changes in thoughts, feelings or behavior since previous session.  Patient emotional ratings determined 6/10 for depression, 7/10 for anxiety, 2/10 for anger/irritability, and denied any panic attacks. Pt provided updates on work/life balance: job, pending charges (2 [employer]er, jewellery by employee). Pt reports her medication of Prozac increase seems to be working well.  Clinician utilized CBT to process concerns and challenges. Clinician processed thoughts, feelings, and behaviors. Clinician discussed coping skills and options for supporting herself through these challenging times. 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 Clinician processed with client changes over the previous week to support sobriety and build a recovery network. Clinician praised client engagement with 12 step  meeting attendance, work with sponsor and step work (Step 3).  Patient reports on the meetings she attended, step work and working with sponsor.    Suicidal/Homicidal: No  Assessment and Plan: Counselor will continue to meet with patient to address treatment plan goals. Patient will continue to follow recommendations of providers and implement skills learned in session.   Follow Up Instructions: I discussed the assessment and treatment plan with the patient. The patient was provided an opportunity to ask questions and all were answered. The patient agreed with the plan and demonstrated an  understanding of the instructions.   The patient was advised to call back or seek an in-person evaluation if the symptoms worsen or if the condition fails to  improve as anticipated.   Collaboration of Care: Other: Reach  out to [facility] for classes and info on Peer Support Specialist, Continue to see [facility] for medication management  Patient/Guardian was advised Release of Information must be obtained prior to any record release in order to collaborate their care with an outside provider. Patient/Guardian was advised if they have not already done so to contact the registration department to sign all necessary forms in order for [name] to release information regarding their care.   Consent: Patient/Guardian gives verbal consent for treatment and assignment of benefits for services provided during this visit. Patient/Guardian expressed understanding and agreed to proceed.      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   Ziomara Birenbaum S, LCAS 02/16/22

## 2022-02-23 ENCOUNTER — Ambulatory Visit (INDEPENDENT_AMBULATORY_CARE_PROVIDER_SITE_OTHER): Payer: 59 | Admitting: Licensed Clinical Social Worker

## 2022-02-23 ENCOUNTER — Encounter (HOSPITAL_COMMUNITY): Payer: Self-pay | Admitting: Licensed Clinical Social Worker

## 2022-02-23 DIAGNOSIS — F1994 Other psychoactive substance use, unspecified with psychoactive substance-induced mood disorder: Secondary | ICD-10-CM

## 2022-02-23 DIAGNOSIS — F1510 Other stimulant abuse, uncomplicated: Secondary | ICD-10-CM | POA: Diagnosis not present

## 2022-02-23 NOTE — Progress Notes (Signed)
Virtual Visit via video Note  I connected with Maria Pineda on 02/23/22 at 9:15am by a video enabled telemedicine application and verified that I am speaking with the correct person using two identifiers.  Location: Patient: home Provider: home office   I discussed the limitations of evaluation and management by telemedicine and the availability of in person appointments. The patient expressed understanding and agreed to proceed.   I discussed the assessment and treatment plan with the patient. The patient was provided an opportunity to ask questions and all were answered. The patient agreed with the plan and demonstrated an understanding of the instructions.   The patient was advised to call back or seek an in-person evaluation if the symptoms worsen or if the condition fails to improve as anticipated.  I provided 45 minutes of non-face-to-face time during this encounter.   Oniel Meleski S, LCAS    THERAPIST PROGRESS NOTE  Session Time: 9:15-10:00 am  Participation Level: Active  Behavioral Response: CasualAlert  Type of Therapy: Individual Therapy  Treatment Goals addressed: Pt will meet with clinician in person weekly for therapy to monitor for progress towards goals and address any barriers to success; Reduce depression from average severity level of 6/10 down to a 4/10 in next 90 days by engaging in 1-3 positive coping skills daily as part of developing self-care routine; Reduce average anxiety level from 7/10 down to 5/10 in next 90 days by utilizing 1-3 relaxation skills/grounding skills per day, such as mindful breathing, progressive muscle relaxation, positive visualizations. Pt will remain free from all substances for the next 90 days as evidenced by self-report, meeting attendance, step work, meet with sponsor.   Progress Towards Goal: Progressing   Interventions: Supportive  Summary: Patient presented for today's session on time and was alert, oriented x5, with no  evidence or self-report of SI/HI or A/V H.  Patient reported ongoing compliance with medication and denied any use of alcohol or illicit substances since last session. Clinician inquired about patient's current emotional ratings, as well as any significant changes in thoughts, feelings or behavior since previous session.  Patient emotional ratings determined 6/10 for depression, 7/10 for anxiety, 2/10 for anger/irritability, and denied any panic attacks. Pt provided updates on work/life balance: job, pending charges (2 [employer] larceny by employee), medication, medication management appt. Cln provided education on [employer] testing and suggested she ask her NP for a referral..  Clinician utilized CBT to process concerns and challenges. Clinician processed thoughts, feelings, and behaviors. Clinician discussed coping skills and options for supporting herself through these challenging times. Cln emailed pt worksheet on "challenging negative thoughts."  Cln and pt reviewed the worksheet helping pt challenge negative thoughts." Clinician processed with client changes over the previous week to support sobriety and build a recovery network. Clinician praised client engagement with 12 step  meeting attendance, work with sponsor and step work (Step 3).  Patient reports on the meetings she attended, step work and working with sponsor. Cln and pt reviewed NA meetings.    Suicidal/Homicidal: No  Assessment and Plan: Counselor will continue to meet with patient to address treatment plan goals. Patient will continue to follow recommendations of providers and implement skills learned in session.   Follow Up Instructions: I discussed the assessment and treatment plan with the patient. The patient was provided an opportunity to ask questions and all were answered. The patient agreed with the plan and demonstrated an  understanding of the instructions.   The patient was advised to call back or  seek an in-person evaluation if  the symptoms worsen or if the condition fails to improve as anticipated.   Collaboration of Care: Other: Reach out to [facility] for classes and info on Peer Support Specialist, Continue to see [facility] for medication management  Patient/Guardian was advised Release of Information must be obtained prior to any record release in order to collaborate their care with an outside provider. Patient/Guardian was advised if they have not already done so to contact the registration department to sign all necessary forms in order for [name] to release information regarding their care.   Consent: Patient/Guardian gives verbal consent for treatment and assignment of benefits for services provided during this visit. Patient/Guardian expressed understanding and agreed to proceed.      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   Symphany Fleissner S, LCAS 02/23/22

## 2022-03-02 ENCOUNTER — Ambulatory Visit (INDEPENDENT_AMBULATORY_CARE_PROVIDER_SITE_OTHER): Payer: 59

## 2022-03-02 ENCOUNTER — Other Ambulatory Visit: Payer: Self-pay | Admitting: Pain Medicine

## 2022-03-02 ENCOUNTER — Encounter (HOSPITAL_COMMUNITY): Payer: Self-pay | Admitting: Licensed Clinical Social Worker

## 2022-03-02 ENCOUNTER — Ambulatory Visit (INDEPENDENT_AMBULATORY_CARE_PROVIDER_SITE_OTHER): Payer: 59 | Admitting: Licensed Clinical Social Worker

## 2022-03-02 DIAGNOSIS — M545 Low back pain, unspecified: Secondary | ICD-10-CM | POA: Diagnosis not present

## 2022-03-02 DIAGNOSIS — F1510 Other stimulant abuse, uncomplicated: Secondary | ICD-10-CM

## 2022-03-02 DIAGNOSIS — M7918 Myalgia, other site: Secondary | ICD-10-CM

## 2022-03-02 DIAGNOSIS — F1994 Other psychoactive substance use, unspecified with psychoactive substance-induced mood disorder: Secondary | ICD-10-CM | POA: Diagnosis not present

## 2022-03-02 DIAGNOSIS — M47817 Spondylosis without myelopathy or radiculopathy, lumbosacral region: Secondary | ICD-10-CM

## 2022-03-02 NOTE — Progress Notes (Signed)
Virtual Visit via video Note  I connected with Maria Pineda on 03/02/22 at 9:15am by a video enabled telemedicine application and verified that I am speaking with the correct person using two identifiers.  Location: Patient: home Provider: home office   I discussed the limitations of evaluation and management by telemedicine and the availability of in person appointments. The patient expressed understanding and agreed to proceed.   I discussed the assessment and treatment plan with the patient. The patient was provided an opportunity to ask questions and all were answered. The patient agreed with the plan and demonstrated an understanding of the instructions.   The patient was advised to call back or seek an in-person evaluation if the symptoms worsen or if the condition fails to improve as anticipated.  I provided 45 minutes of non-face-to-face time during this encounter.   Maria Pineda S, LCAS    THERAPIST PROGRESS NOTE  Session Time: 9:15-10:00 am  Participation Level: Active  Behavioral Response: CasualAlert  Type of Therapy: Individual Therapy  Treatment Goals addressed: Pt will meet with clinician in person weekly for therapy to monitor for progress towards goals and address any barriers to success; Reduce depression from average severity level of 6/10 down to a 4/10 in next 90 days by engaging in 1-3 positive coping skills daily as part of developing self-care routine; Reduce average anxiety level from 7/10 down to 5/10 in next 90 days by utilizing 1-3 relaxation skills/grounding skills per day, such as mindful breathing, progressive muscle relaxation, positive visualizations. Pt will remain free from all substances for the next 90 days as evidenced by self-report, meeting attendance, step work, meet with sponsor.   Progress Towards Goal: Progressing   Interventions: Supportive  Summary: Patient presented for today's session on time and was alert, oriented x5, with no  evidence or self-report of SI/HI or A/V H.  Patient reported ongoing compliance with medication and denied any use of alcohol or illicit substances since last session. Clinician inquired about patient's current emotional ratings, as well as any significant changes in thoughts, feelings or behavior since previous session.  Patient emotional ratings determined 6/10 for depression, 7/10 for anxiety, 2/10 for anger/irritability, and denied any panic attacks. Pt provided updates on work/life balance: job, pending charges (2 [employer] larceny by employee), medication, medication management appt. Clinician utilized CBT to process concerns and challenges. Clinician processed thoughts, feelings, and behaviors. Clinician discussed coping skills and options for supporting herself through these challenging times.  Clinician processed with client changes over the previous week to support sobriety and build a recovery network. Clinician praised client engagement with 12 step  meeting attendance, work with sponsor and step work (Step 3).  Patient reports on the meetings she attended, step work and working with sponsor. Cln and pt reviewed NA meetings. Cln and pt explored "Higher Power.I'm really struggling with finding my higher power."    Suicidal/Homicidal: No  Assessment and Plan: Counselor will continue to meet with patient to address treatment plan goals. Patient will continue to follow recommendations of providers and implement skills learned in session.   Follow Up Instructions: I discussed the assessment and treatment plan with the patient. The patient was provided an opportunity to ask questions and all were answered. The patient agreed with the plan and demonstrated an  understanding of the instructions.   The patient was advised to call back or seek an in-person evaluation if the symptoms worsen or if the condition fails to improve as anticipated.   Collaboration of Care:  Other: Reach out to [facility] for classes  and info on Peer Support Specialist, Continue to see [facility] for medication management  Patient/Guardian was advised Release of Information must be obtained prior to any record release in order to collaborate their care with an outside provider. Patient/Guardian was advised if they have not already done so to contact the registration department to sign all necessary forms in order for [name] to release information regarding their care.   Consent: Patient/Guardian gives verbal consent for treatment and assignment of benefits for services provided during this visit. Patient/Guardian expressed understanding and agreed to proceed.      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   Maria Pineda S, LCAS 03/02/22

## 2022-03-09 ENCOUNTER — Encounter (HOSPITAL_COMMUNITY): Payer: Self-pay | Admitting: Licensed Clinical Social Worker

## 2022-03-09 ENCOUNTER — Ambulatory Visit (INDEPENDENT_AMBULATORY_CARE_PROVIDER_SITE_OTHER): Payer: 59 | Admitting: Licensed Clinical Social Worker

## 2022-03-09 DIAGNOSIS — F1510 Other stimulant abuse, uncomplicated: Secondary | ICD-10-CM

## 2022-03-09 DIAGNOSIS — F1994 Other psychoactive substance use, unspecified with psychoactive substance-induced mood disorder: Secondary | ICD-10-CM

## 2022-03-09 NOTE — Progress Notes (Signed)
Virtual Visit via video Note  I connected with Maria Pineda on 03/09/22 at 9:15am by a video enabled telemedicine application and verified that I am speaking with the correct person using two identifiers.  Location: Patient: home Provider: home office   I discussed the limitations of evaluation and management by telemedicine and the availability of in person appointments. The patient expressed understanding and agreed to proceed.   I discussed the assessment and treatment plan with the patient. The patient was provided an opportunity to ask questions and all were answered. The patient agreed with the plan and demonstrated an understanding of the instructions.   The patient was advised to call back or seek an in-person evaluation if the symptoms worsen or if the condition fails to improve as anticipated.  I provided 45 minutes of non-face-to-face time during this encounter.   Parvin Stetzer S, LCAS    THERAPIST PROGRESS NOTE  Session Time: 9:15-10:00 am  Participation Level: Active  Behavioral Response: CasualAlert  Type of Therapy: Individual Therapy  Treatment Goals addressed: Pt will meet with clinician in person weekly for therapy to monitor for progress towards goals and address any barriers to success; Reduce depression from average severity level of 6/10 down to a 4/10 in next 90 days by engaging in 1-3 positive coping skills daily as part of developing self-care routine; Reduce average anxiety level from 7/10 down to 5/10 in next 90 days by utilizing 1-3 relaxation skills/grounding skills per day, such as mindful breathing, progressive muscle relaxation, positive visualizations. Pt will remain free from all substances for the next 90 days as evidenced by self-report, meeting attendance, step work, meet with sponsor.   Progress Towards Goal: Progressing   Interventions: Supportive  Summary: Patient presented for today's session on time and was alert, oriented x5, with no  evidence or self-report of SI/HI or A/V H.  Patient reported ongoing compliance with medication and denied any use of alcohol or illicit substances since last session. Clinician inquired about patient's current emotional ratings, as well as any significant changes in thoughts, feelings or behavior since previous session.  Patient emotional ratings determined 6/10 for depression, 5/10 for anxiety, 2/10 for anger/irritability, and denied any panic attacks. Pt provided updates on work/life balance: job, pending charges (2 [employer] larceny by employee), medication adherence. Clinician utilized CBT to process concerns and challenges. Clinician processed thoughts, feelings, and behaviors. Clinician discussed coping skills and options for supporting herself through these challenging times.  Clinician processed with client changes over the previous week to support sobriety and build a recovery network. Clinician praised client engagement with 12 step  meeting attendance, work with sponsor and step work (Step 3).  Patient reports on the meetings she attended, step work and working with sponsor.  Cln and pt explored "Higher Power. I finished Step 3 questions but I'm still struggling with identifying find my higher power." Cln and pt explored the principles of NA step 3, which she was readily accepted. Cln reviewed tx plan with pt who verbally accepted the plan.    Suicidal/Homicidal: No  Assessment and Plan: Counselor will continue to meet with patient to address treatment plan goals. Patient will continue to follow recommendations of providers and implement skills learned in session.   Follow Up Instructions: I discussed the assessment and treatment plan with the patient. The patient was provided an opportunity to ask questions and all were answered. The patient agreed with the plan and demonstrated an  understanding of the instructions.   The patient was  advised to call back or seek an in-person evaluation if the  symptoms worsen or if the condition fails to improve as anticipated.   Collaboration of Care: Other:  Continue to see [facility] for medication management, request genetic testing  Patient/Guardian was advised Release of Information must be obtained prior to any record release in order to collaborate their care with an outside provider. Patient/Guardian was advised if they have not already done so to contact the registration department to sign all necessary forms in order for [name] to release information regarding their care.   Consent: Patient/Guardian gives verbal consent for treatment and assignment of benefits for services provided during this visit. Patient/Guardian expressed understanding and agreed to proceed.      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   Azani Brogdon S, LCAS 03/02/22

## 2022-03-16 ENCOUNTER — Ambulatory Visit (INDEPENDENT_AMBULATORY_CARE_PROVIDER_SITE_OTHER): Payer: 59 | Admitting: Licensed Clinical Social Worker

## 2022-03-16 ENCOUNTER — Encounter (HOSPITAL_COMMUNITY): Payer: Self-pay | Admitting: Licensed Clinical Social Worker

## 2022-03-16 DIAGNOSIS — F1510 Other stimulant abuse, uncomplicated: Secondary | ICD-10-CM

## 2022-03-16 DIAGNOSIS — F1994 Other psychoactive substance use, unspecified with psychoactive substance-induced mood disorder: Secondary | ICD-10-CM

## 2022-03-16 NOTE — Progress Notes (Signed)
Virtual Visit via video Note  I connected with Maria Pineda on 03/16/22 at 9:15am by a video enabled telemedicine application and verified that I am speaking with the correct person using two identifiers.  Location: Patient: home Provider: home office   I discussed the limitations of evaluation and management by telemedicine and the availability of in person appointments. The patient expressed understanding and agreed to proceed.   I discussed the assessment and treatment plan with the patient. The patient was provided an opportunity to ask questions and all were answered. The patient agreed with the plan and demonstrated an understanding of the instructions.   The patient was advised to call back or seek an in-person evaluation if the symptoms worsen or if the condition fails to improve as anticipated.  I provided 45 minutes of non-face-to-face time during this encounter.   Percy Winterrowd S, LCAS    THERAPIST PROGRESS NOTE  Session Time: 9:15-10:00 am  Participation Level: Active  Behavioral Response: CasualAlert  Type of Therapy: Individual Therapy  Treatment Goals addressed: Pt will meet with clinician in person weekly for therapy to monitor for progress towards goals and address any barriers to success; Reduce depression from average severity level of 6/10 down to a 4/10 in next 90 days by engaging in 1-3 positive coping skills daily as part of developing self-care routine; Reduce average anxiety level from 7/10 down to 5/10 in next 90 days by utilizing 1-3 relaxation skills/grounding skills per day, such as mindful breathing, progressive muscle relaxation, positive visualizations. Pt will remain free from all substances for the next 90 days as evidenced by self-report, meeting attendance, step work, meet with sponsor.   Progress Towards Goal: Progressing   Interventions: Supportive  Summary: Patient presented for today's session on time and was alert, oriented x5, with no  evidence or self-report of SI/HI or A/V H.  Patient reported ongoing compliance with medication and denied any use of alcohol or illicit substances since last session. Clinician inquired about patient's current emotional ratings, as well as any significant changes in thoughts, feelings or behavior since previous session.  Patient emotional ratings determined 6/10 for depression, 5/10 for anxiety, 2/10 for anger/irritability, and denied any panic attacks. Pt provided updates on work/life balance: job, pending charges (2 [employer] larceny by employee), medication adherence. Pt talked about the relationship (lack of) with her sons. Cln asked open ended questions. Clinician utilized CBT to process concerns and challenges. Clinician processed thoughts, feelings, and behaviors. Clinician discussed coping skills and options for supporting herself through these challenging times.  Clinician processed with client changes over the previous week to support sobriety and build a recovery network. Clinician praised client engagement with 12 step  meeting attendance, work with sponsor and step work (Step 3).  Patient reports on the meetings she attended, step work and working with sponsor. Cln and pt reviewed the questions in NA network that go along with Step 3. Pt received her gene test to self administer.    Suicidal/Homicidal: No  Assessment and Plan: Counselor will continue to meet with patient to address treatment plan goals. Patient will continue to follow recommendations of providers and implement skills learned in session.   Follow Up Instructions: I discussed the assessment and treatment plan with the patient. The patient was provided an opportunity to ask questions and all were answered. The patient agreed with the plan and demonstrated an  understanding of the instructions.   The patient was advised to call back or seek an in-person evaluation if  the symptoms worsen or if the condition fails to improve as  anticipated.   Collaboration of Care: Other:  Continue to see [facility] for medication management  Patient/Guardian was advised Release of Information must be obtained prior to any record release in order to collaborate their care with an outside provider. Patient/Guardian was advised if they have not already done so to contact the registration department to sign all necessary forms in order for [name] to release information regarding their care.   Consent: Patient/Guardian gives verbal consent for treatment and assignment of benefits for services provided during this visit. Patient/Guardian expressed understanding and agreed to proceed.      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   Freddi Schrager S, LCAS 03/16/22

## 2022-03-23 ENCOUNTER — Ambulatory Visit (INDEPENDENT_AMBULATORY_CARE_PROVIDER_SITE_OTHER): Payer: 59 | Admitting: Licensed Clinical Social Worker

## 2022-03-23 ENCOUNTER — Encounter (HOSPITAL_COMMUNITY): Payer: Self-pay | Admitting: Licensed Clinical Social Worker

## 2022-03-23 DIAGNOSIS — F1994 Other psychoactive substance use, unspecified with psychoactive substance-induced mood disorder: Secondary | ICD-10-CM

## 2022-03-23 DIAGNOSIS — F1510 Other stimulant abuse, uncomplicated: Secondary | ICD-10-CM

## 2022-03-23 NOTE — Progress Notes (Signed)
Virtual Visit via video Note  I connected with Maria Pineda on 03/23/22 at 9:15am by a video enabled telemedicine application and verified that I am speaking with the correct person using two identifiers.  Location: Patient: home Provider: home office   I discussed the limitations of evaluation and management by telemedicine and the availability of in person appointments. The patient expressed understanding and agreed to proceed.   I discussed the assessment and treatment plan with the patient. The patient was provided an opportunity to ask questions and all were answered. The patient agreed with the plan and demonstrated an understanding of the instructions.   The patient was advised to call back or seek an in-person evaluation if the symptoms worsen or if the condition fails to improve as anticipated.  I provided 45 minutes of non-face-to-face time during this encounter.   Maria Pineda S, LCAS    THERAPIST PROGRESS NOTE  Session Time: 9:15-10:00 am  Participation Level: Active  Behavioral Response: CasualAlert  Type of Therapy: Individual Therapy  Treatment Goals addressed: Pt will meet with clinician in person weekly for therapy to monitor for progress towards goals and address any barriers to success; Reduce depression from average severity level of 6/10 down to a 4/10 in next 90 days by engaging in 1-3 positive coping skills daily as part of developing self-care routine; Reduce average anxiety level from 7/10 down to 5/10 in next 90 days by utilizing 1-3 relaxation skills/grounding skills per day, such as mindful breathing, progressive muscle relaxation, positive visualizations. Pt will remain free from all substances for the next 90 days as evidenced by self-report, meeting attendance, step work, meet with sponsor.   Progress Towards Goal: Progressing   Interventions: Supportive  Summary: Patient presented for today's session on time and was alert, oriented x5, with no  evidence or self-report of SI/HI or A/V H.  Patient reported ongoing compliance with medication and denied any use of alcohol or illicit substances since last session. Clinician inquired about patient's current emotional ratings, as well as any significant changes in thoughts, feelings or behavior since previous session.  Patient emotional ratings determined 6/10 for depression, 5/10 for anxiety, 2/10 for anger/irritability, and denied any panic attacks. Pt provided updates on work/life balance: job, pending charges (2 [employer] larceny by employee), medication adherence. Pt meets with her attorney today for her upcoming charges: "I'm worried." Cln and pt explored courtroom etiquette, expectations of charges, fear, talking to atty about diversion programs. Cln and pt reviewed step 3, meeting attendance, step work, sponsor.Cln asked open ended questions. Clinician utilized CBT to process concerns and challenges. Clinician processed thoughts, feelings, and behaviors. Clinician discussed coping skills and options for supporting herself through these challenging times.   Plan:Pt received her gene test to self administer.    Suicidal/Homicidal: No  Assessment and Plan: Counselor will continue to meet with patient to address treatment plan goals. Patient will continue to follow recommendations of providers and implement skills learned in session.   Follow Up Instructions: I discussed the assessment and treatment plan with the patient. The patient was provided an opportunity to ask questions and all were answered. The patient agreed with the plan and demonstrated an  understanding of the instructions.   The patient was advised to call back or seek an in-person evaluation if the symptoms worsen or if the condition fails to improve as anticipated.   Collaboration of Care: Other:  Continue to see [facility] for medication management  Patient/Guardian was advised Release of Information must  be obtained  prior to any record release in order to collaborate their care with an outside provider. Patient/Guardian was advised if they have not already done so to contact the registration department to sign all necessary forms in order for [name] to release information regarding their care.   Consent: Patient/Guardian gives verbal consent for treatment and assignment of benefits for services provided during this visit. Patient/Guardian expressed understanding and agreed to proceed.      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   Maria Pineda S, LCAS 03/16/22

## 2022-03-30 ENCOUNTER — Ambulatory Visit (INDEPENDENT_AMBULATORY_CARE_PROVIDER_SITE_OTHER): Payer: 59 | Admitting: Licensed Clinical Social Worker

## 2022-03-30 ENCOUNTER — Encounter (HOSPITAL_COMMUNITY): Payer: Self-pay | Admitting: Licensed Clinical Social Worker

## 2022-03-30 ENCOUNTER — Ambulatory Visit (HOSPITAL_COMMUNITY): Payer: 59 | Admitting: Licensed Clinical Social Worker

## 2022-03-30 DIAGNOSIS — F1994 Other psychoactive substance use, unspecified with psychoactive substance-induced mood disorder: Secondary | ICD-10-CM

## 2022-03-30 DIAGNOSIS — F1510 Other stimulant abuse, uncomplicated: Secondary | ICD-10-CM

## 2022-04-02 ENCOUNTER — Encounter (HOSPITAL_COMMUNITY): Payer: Self-pay | Admitting: Licensed Clinical Social Worker

## 2022-04-02 NOTE — Progress Notes (Signed)
Virtual Visit via Video Note  I connected with Maria Pineda on 12/08/21 at 9:15am by a video enabled telemedicine application and verified that I am speaking with the correct person using two identifiers.  Location: Patient: home Provider: home office   I discussed the limitations of evaluation and management by telemedicine and the availability of in person appointments. The patient expressed understanding and agreed to proceed.   I discussed the assessment and treatment plan with the patient. The patient was provided an opportunity to ask questions and all were answered. The patient agreed with the plan and demonstrated an understanding of the instructions.   The patient was advised to call back or seek an in-person evaluation if the symptoms worsen or if the condition fails to improve as anticipated.  I provided 45 minutes of non-face-to-face time during this encounter.   Desirai Traxler S, LCAS    THERAPIST PROGRESS NOTE  Session Time: 9:15-10:00 am  Participation Level: Active  Behavioral Response: CasualAlert  Type of Therapy: Individual Therapy  Treatment Goals addressed: Pt will meet with clinician in person weekly for therapy to monitor for progress towards goals and address any barriers to success; Reduce depression from average severity level of 6/10 down to a 4/10 in next 90 days by engaging in 1-3 positive coping skills daily as part of developing self-care routine; Reduce average anxiety level from 7/10 down to 5/10 in next 90 days by utilizing 1-3 relaxation skills/grounding skills per day, such as mindful breathing, progressive muscle relaxation, positive visualizations. Pt will remain free from all substances for the next 90 days as evidenced by self-report, meeting attendance, step work, meet with sponsor.   Progress Towards Goal: Progressing   Interventions: Supportive  Summary: Patient presented for today's session on time and was alert, oriented x5, with no  evidence or self-report of SI/HI or A/V H.  Patient reported ongoing compliance with medication and denied any use of alcohol or illicit substances since last session. Clinician inquired about patient's current emotional ratings, as well as any significant changes in thoughts, feelings or behavior since previous session.  Patient emotional scores are determined 6/10 for depression, 6/10 for anxiety, 2/10 for anger/irritability, and denied any panic attacks. Pt provided updates on work/life balance. Clinician processed with client changes over the past week to support sobriety and build recovery network. Clinician praised client engagement with 12 step community meeting attendance, work with sponsor and step work. "I'm still working on the 2nd step." Cln and pt discussed complacency and putting off step work, finding alternatives that working steps. Patient reports on the meetings she attends and was encouraged to go to additional meetings. CLn and pt discussed AA vs NA, acceptance in AA when there is no alcohol use. Cln encouraged pt to go on virtual meetings to see if they are more accepting. Cln provided a list of women's meetings in the [place] area. Cln and pt reviewed Step 2 and why she struggles with the step.`"A power greater than ourselves..." Pt reports she was not raised in the church and religious beliefs were not discussed at home. Cln and pt discussed faith which led to a discussion of a power greater than ourselves.    Suicidal/Homicidal: No  Assessment and Plan: Counselor will continue to meet with patient to address treatment plan goals. Patient will continue to follow recommendations of providers and implement skills learned in session.   Follow Up Instructions: I discussed the assessment and treatment plan with the patient. The patient was provided an  opportunity to ask questions and all were answered. The patient agreed with the plan and demonstrated an  understanding of the  instructions.   The patient was advised to call back or seek an in-person evaluation if the symptoms worsen or if the condition fails to improve as anticipated.   Collaboration of Care: Continue with medication managment  Patient/Guardian was advised Release of Information must be obtained prior to any record release in order to collaborate their care with an outside provider. Patient/Guardian was advised if they have not already done so to contact the registration department to sign all necessary forms in order for [name] to release information regarding their care.   Consent: Patient/Guardian gives verbal consent for treatment and assignment of benefits for services provided during this visit. Patient/Guardian expressed understanding and agreed to proceed.      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

## 2022-04-12 DIAGNOSIS — M47816 Spondylosis without myelopathy or radiculopathy, lumbar region: Secondary | ICD-10-CM | POA: Diagnosis not present

## 2022-04-24 NOTE — Progress Notes (Signed)
Virtual Visit via video Note  I connected with Maria Pineda on 03/30/22 at 1:00pm by a video enabled telemedicine application and verified that I am speaking with the correct person using two identifiers.  Location: Patient: home Provider: home office   I discussed the limitations of evaluation and management by telemedicine and the availability of in person appointments. The patient expressed understanding and agreed to proceed.   I discussed the assessment and treatment plan with the patient. The patient was provided an opportunity to ask questions and all were answered. The patient agreed with the plan and demonstrated an understanding of the instructions.   The patient was advised to call back or seek an in-person evaluation if the symptoms worsen or if the condition fails to improve as anticipated.  I provided 45 minutes of non-face-to-face time during this encounter.   Kala Ambriz S, LCAS    THERAPIST PROGRESS NOTE  Session Time: 1:00-1:45pm  Participation Level: Active  Behavioral Response: CasualAlert  Type of Therapy: Individual Therapy  Treatment Goals addressed: Pt will meet with clinician in person weekly for therapy to monitor for progress towards goals and address any barriers to success; Reduce depression from average severity level of 6/10 down to a 4/10 in next 90 days by engaging in 1-3 positive coping skills daily as part of developing self-care routine; Reduce average anxiety level from 7/10 down to 5/10 in next 90 days by utilizing 1-3 relaxation skills/grounding skills per day, such as mindful breathing, progressive muscle relaxation, positive visualizations. Pt will remain free from all substances for the next 90 days as evidenced by self-report, meeting attendance, step work, meeting with sponsor.   Progress Towards Goal: Progressing   Interventions: Supportive  Summary: Patient presented for today's session on time and was alert, oriented x5, with  no evidence or self-report of SI/HI or A/V H.  Patient reported ongoing compliance with medication and denied any use of alcohol or illicit substances since last session. Clinician inquired about patient's current emotional ratings, as well as any significant changes in thoughts, feelings or behavior since previous session.  Patient emotional ratings determined 6/10 for depression, 5/10 for anxiety, 2/10 for anger/irritability, and denied any panic attacks. Pt provided updates on work/life balance: job, pending felony charges.Clinician utilized CBT to process concerns and challenges, and mindfulness coping skills..Clinician processed with client changes over the previous week to support sobriety and build recovery network. Clinician praised client engagement with 12 step community and meeting attendance, meeting with sponsor and work 12 steps. Cln and pt reviewed her current step work.     Plan:Pt received her gene test to self administer.    Suicidal/Homicidal: No  Assessment and Plan: Counselor will continue to meet with patient to address treatment plan goals. Patient will continue to follow recommendations of providers and implement skills learned in session.   Follow Up Instructions: I discussed the assessment and treatment plan with the patient. The patient was provided an opportunity to ask questions and all were answered. The patient agreed with the plan and demonstrated an  understanding of the instructions.   The patient was advised to call back or seek an in-person evaluation if the symptoms worsen or if the condition fails to improve as anticipated.   Collaboration of Care: Other:  Continue to see [facility] for medication management  Patient/Guardian was advised Release of Information must be obtained prior to any record release in order to collaborate their care with an outside provider. Patient/Guardian was advised if they have  not already done so to contact the registration  department to sign all necessary forms in order for [name] to release information regarding their care.   Consent: Patient/Guardian gives verbal consent for treatment and assignment of benefits for services provided during this visit. Patient/Guardian expressed understanding and agreed to proceed.      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   [place], LCAS 03/30/22

## 2022-04-25 DIAGNOSIS — M47816 Spondylosis without myelopathy or radiculopathy, lumbar region: Secondary | ICD-10-CM | POA: Diagnosis not present

## 2022-06-27 ENCOUNTER — Other Ambulatory Visit (HOSPITAL_COMMUNITY)
Admission: RE | Admit: 2022-06-27 | Discharge: 2022-06-27 | Disposition: A | Discharge status: Home / Self Care | Payer: 59 | Source: Ambulatory Visit | Attending: Physician Assistant | Admitting: Physician Assistant

## 2022-06-27 ENCOUNTER — Encounter: Payer: Self-pay | Admitting: Physician Assistant

## 2022-06-27 ENCOUNTER — Ambulatory Visit (INDEPENDENT_AMBULATORY_CARE_PROVIDER_SITE_OTHER): Payer: 59 | Admitting: Physician Assistant

## 2022-06-27 VITALS — BP 128/84 | HR 82 | Temp 97.1°F | Ht 61.22 in | Wt 184.6 lb

## 2022-06-27 DIAGNOSIS — Z1211 Encounter for screening for malignant neoplasm of colon: Secondary | ICD-10-CM | POA: Diagnosis not present

## 2022-06-27 DIAGNOSIS — Z01419 Encounter for gynecological examination (general) (routine) without abnormal findings: Secondary | ICD-10-CM | POA: Insufficient documentation

## 2022-06-27 DIAGNOSIS — Z0000 Encounter for general adult medical examination without abnormal findings: Secondary | ICD-10-CM | POA: Diagnosis not present

## 2022-06-27 DIAGNOSIS — F419 Anxiety disorder, unspecified: Secondary | ICD-10-CM

## 2022-06-27 DIAGNOSIS — F32A Depression, unspecified: Secondary | ICD-10-CM

## 2022-06-27 DIAGNOSIS — Z23 Encounter for immunization: Secondary | ICD-10-CM

## 2022-06-27 DIAGNOSIS — F319 Bipolar disorder, unspecified: Secondary | ICD-10-CM | POA: Diagnosis not present

## 2022-06-27 HISTORY — DX: Encounter for gynecological examination (general) (routine) without abnormal findings: Z01.419

## 2022-06-27 LAB — CBC WITH DIFFERENTIAL/PLATELET
Basophils Absolute: 0.1 10*3/uL (ref 0.0–0.1)
Basophils Relative: 0.7 % (ref 0.0–3.0)
Eosinophils Absolute: 0.3 10*3/uL (ref 0.0–0.7)
Eosinophils Relative: 3.1 % (ref 0.0–5.0)
HCT: 40.6 % (ref 36.0–46.0)
Hemoglobin: 13.5 g/dL (ref 12.0–15.0)
Lymphocytes Relative: 20 % (ref 12.0–46.0)
Lymphs Abs: 1.9 10*3/uL (ref 0.7–4.0)
MCHC: 33.3 g/dL (ref 30.0–36.0)
MCV: 88.1 fl (ref 78.0–100.0)
Monocytes Absolute: 0.7 10*3/uL (ref 0.1–1.0)
Monocytes Relative: 7.8 % (ref 3.0–12.0)
Neutro Abs: 6.5 10*3/uL (ref 1.4–7.7)
Neutrophils Relative %: 68.4 % (ref 43.0–77.0)
Platelets: 422 10*3/uL — ABNORMAL HIGH (ref 150.0–400.0)
RBC: 4.61 Mil/uL (ref 3.87–5.11)
RDW: 13.4 % (ref 11.5–15.5)
WBC: 9.4 10*3/uL (ref 4.0–10.5)

## 2022-06-27 LAB — COMPREHENSIVE METABOLIC PANEL
ALT: 18 U/L (ref 0–35)
AST: 16 U/L (ref 0–37)
Albumin: 4.3 g/dL (ref 3.5–5.2)
Alkaline Phosphatase: 115 U/L (ref 39–117)
BUN: 16 mg/dL (ref 6–23)
CO2: 27 mEq/L (ref 19–32)
Calcium: 9.7 mg/dL (ref 8.4–10.5)
Chloride: 99 mEq/L (ref 96–112)
Creatinine, Ser: 1.13 mg/dL (ref 0.40–1.20)
GFR: 55.29 mL/min — ABNORMAL LOW (ref >60.00)
Glucose, Bld: 96 mg/dL (ref 70–99)
Potassium: 4.1 mEq/L (ref 3.5–5.1)
Sodium: 134 mEq/L — ABNORMAL LOW (ref 135–145)
Total Bilirubin: 0.5 mg/dL (ref 0.2–1.2)
Total Protein: 6.7 g/dL (ref 6.0–8.3)

## 2022-06-27 LAB — LIPID PANEL
Cholesterol: 173 mg/dL (ref 0–200)
HDL: 69.6 mg/dL (ref >39.00)
LDL Cholesterol: 84 mg/dL (ref 0–99)
NonHDL: 103.46
Total CHOL/HDL Ratio: 2
Triglycerides: 97 mg/dL (ref 0.0–149.0)
VLDL: 19.4 mg/dL (ref 0.0–40.0)

## 2022-06-27 NOTE — Assessment & Plan Note (Signed)
Working with psychiatry  

## 2022-06-27 NOTE — Assessment & Plan Note (Signed)
Age-appropriate screening and counseling performed today. Will check labs and call with results. Preventive measures discussed and printed in AVS for patient. Pap updated. Mammo handout provided. Cologuard ordered. Flu shot completed.   Patient Counseling: [x]   Nutrition: Stressed importance of moderation in sodium/caffeine intake, saturated fat and cholesterol, caloric balance, sufficient intake of fresh fruits, vegetables, and fiber.  [x]   Stressed the importance of regular exercise.   [x]   Substance Abuse: Discussed cessation/primary prevention of tobacco, alcohol, or other drug use; driving or other dangerous activities under the influence; availability of treatment for abuse.   []   Injury prevention: Discussed safety belts, safety helmets, smoke detector, smoking near bedding or upholstery.   []   Sexuality: Discussed sexually transmitted diseases, partner selection, use of condoms, avoidance of unintended pregnancy  and contraceptive alternatives.   [x]   Dental health: Discussed importance of regular tooth brushing, flossing, and dental visits.  [x]   Health maintenance and immunizations reviewed. Please refer to Health maintenance section.

## 2022-06-27 NOTE — Progress Notes (Signed)
Subjective:    Patient ID: Maria Pineda, female    DOB: January 23, 1966, 56 y.o.   MRN: [id number]  Chief Complaint  Patient presents with   New Patient (Initial Visit)    New patient in office to establish care with PCP; pt never done Colonoscopy and prefers cologuard and can place order to complete; pt sending immuization records via ychart but requests flu vaccine today    HPI Patient is in today for new patient establishment / well woman exam. No acute concerns today. Recently moved from [place], two adult son still live there. Very overdue for regular labs and preventive care she says.   Past Medical History:  Diagnosis Date   Anxiety Ongoing   Bipolar 1 disorder ([place])    Depression Ongoing   Substance abuse ([place]) 2005   In NA -clean 6 month   Suicide attempt ([place]) 2020   Well woman exam with routine gynecological exam 06/27/2022    Past Surgical History:  Procedure Laterality Date   BREAST SURGERY  Augmentation   CESAREAN SECTION  4/02   COSMETIC SURGERY  6/96, 6/16   Breast augment   EYE SURGERY  08/1998   Lasik - eyes reverted after a few years/wear hard contact lenses   SPINE SURGERY  10/12   Fuse C4/5    Family History  Problem Relation Age of Onset   Arthritis Mother    Cancer Mother    Early death Mother    Anxiety disorder Father    Cancer Father    Depression Father    Early death Father    Cancer Maternal Grandmother    Obesity Maternal Grandmother    Varicose Veins Maternal Grandmother    ADD / ADHD Brother    Drug abuse Brother    Asthma Son    Intellectual disability Maternal Aunt     Social History   Tobacco Use   Smoking status: Some Days    Packs/day: 0.25    Years: 15.00    Total pack years: 3.75    Types: Cigarettes, E-cigarettes    Last attempt to quit: 12/30/2020    Years since quitting: 1.4   Tobacco comments:    Quit cigarettes in 2022. Still use e-cigs occasionally  Vaping Use   Vaping Use: Some days   Substances: Nicotine,  Flavoring  Substance Use Topics   Alcohol use: Not Currently   Drug use: Not Currently    Types: Amphetamines, Benzodiazepines, Cocaine, Codeine, Hydrocodone, Marijuana, Methamphetamines    Comment: In NA and clean working program w/sponsor     No Known Allergies  Review of Systems NEGATIVE UNLESS OTHERWISE INDICATED IN HPI      Objective:     BP 128/84 (BP Location: Left Arm)   Pulse 82   Temp (!) 97.1 F (36.2 C) (Temporal)   Ht 5' 1.22" (1.555 m)   Wt 184 lb 9.6 oz (83.7 kg)   SpO2 96%   BMI 34.63 kg/m   Wt Readings from Last 3 Encounters:  06/27/22 184 lb 9.6 oz (83.7 kg)    BP Readings from Last 3 Encounters:  06/27/22 128/84     Physical Exam Vitals and nursing note reviewed. Exam conducted with a chaperone present.  Constitutional:      Appearance: Normal appearance. She is normal weight. She is not toxic-appearing.  HENT:     Head: Normocephalic and atraumatic.     Right Ear: Tympanic membrane, ear canal and external ear normal.  Left Ear: Tympanic membrane, ear canal and external ear normal.     Nose: Nose normal.     Mouth/Throat:     Mouth: Mucous membranes are moist.  Eyes:     Extraocular Movements: Extraocular movements intact.     Conjunctiva/sclera: Conjunctivae normal.     Pupils: Pupils are equal, round, and reactive to light.  Cardiovascular:     Rate and Rhythm: Normal rate and regular rhythm.     Pulses: Normal pulses.     Heart sounds: Normal heart sounds.  Pulmonary:     Effort: Pulmonary effort is normal.     Breath sounds: Normal breath sounds.  Chest:  Breasts:    Right: Normal.     Left: Normal.     Comments: Bilateral breast augmentation Abdominal:     General: Abdomen is flat. Bowel sounds are normal.     Palpations: Abdomen is soft.  Genitourinary:    General: Normal vulva.     Labia:        Right: No rash.        Left: No rash.      Vagina: Normal.     Cervix: Normal.     Uterus: Normal.      Adnexa: Right  adnexa normal and left adnexa normal.  Musculoskeletal:        General: Normal range of motion.     Cervical back: Normal range of motion and neck supple.  Skin:    General: Skin is warm and dry.     Comments: Linear hypopigmented scars inner thighs and wrists  Neurological:     General: No focal deficit present.     Mental Status: She is alert and oriented to person, place, and time.  Psychiatric:        Mood and Affect: Mood normal.        Behavior: Behavior normal.        Thought Content: Thought content normal.        Judgment: Judgment normal.        Assessment & Plan:  Well woman exam with routine gynecological exam Assessment & Plan: Age-appropriate screening and counseling performed today. Will check labs and call with results. Preventive measures discussed and printed in AVS for patient. Pap updated. Mammo handout provided. Cologuard ordered. Flu shot completed.   Patient Counseling: [x]   Nutrition: Stressed importance of moderation in sodium/caffeine intake, saturated fat and cholesterol, caloric balance, sufficient intake of fresh fruits, vegetables, and fiber.  [x]   Stressed the importance of regular exercise.   [x]   Substance Abuse: Discussed cessation/primary prevention of tobacco, alcohol, or other drug use; driving or other dangerous activities under the influence; availability of treatment for abuse.   []   Injury prevention: Discussed safety belts, safety helmets, smoke detector, smoking near bedding or upholstery.   []   Sexuality: Discussed sexually transmitted diseases, partner selection, use of condoms, avoidance of unintended pregnancy  and contraceptive alternatives.   [x]   Dental health: Discussed importance of regular tooth brushing, flossing, and dental visits.  [x]   Health maintenance and immunizations reviewed. Please refer to Health maintenance section.      Orders: -     Cytology - PAP -     CBC with Differential/Platelet -     Comprehensive  metabolic panel -     Lipid panel -     Thyroid Panel With TSH -     Insulin, random  Screening for colon cancer -     Cologuard  Anxiety and depression Assessment & Plan: Working with psychiatry   Orders: -     Ambulatory referral to Psychology  Bipolar 1 disorder [facility]) Assessment & Plan: Working with psychiatry   Orders: -     Ambulatory referral to Psychology        Return in about 3 months (around 09/27/2022) for recheck .  This note was prepared with assistance of Systems analyst. Occasional wrong-word or sound-a-like substitutions may have occurred due to the inherent limitations of voice recognition software.    Semaja Lymon M Valeda Corzine, PA-C

## 2022-06-27 NOTE — Assessment & Plan Note (Signed)
Working with psychiatry

## 2022-06-27 NOTE — Patient Instructions (Addendum)
Welcome to [facility]! It was a pleasure meeting you today.  As discussed, Please schedule a 3 month follow up visit today.  Pap smear, labs, flu shot today Cologuard ordered Mammogram referral Referral to Dr. Monna Fam  PLEASE NOTE:  If you had any LAB tests please let us know if you have not heard back within a few days. You may see your results on MyChart before we have a chance to review them but we will give you a call once they are reviewed by [name]. If we ordered any REFERRALS today, please let us know if you have not heard from their office within the next two weeks. Let us know through MyChart if you are needing REFILLS, or have your pharmacy send [name] the request. You can also use MyChart to communicate with me or any office staff.  Please try these tips to maintain a healthy lifestyle:  Eat most of your calories during the day when you are active. Eliminate processed foods including packaged sweets (pies, cakes, cookies), reduce intake of potatoes, white bread, white pasta, and white rice. Look for whole grain options, oat flour or almond flour.  Each meal should contain half fruits/vegetables, one quarter protein, and one quarter carbs (no bigger than a computer mouse).  Cut down on sweet beverages. This includes juice, soda, and sweet tea. Also watch fruit intake, though this is a healthier sweet option, it still contains natural sugar! Limit to 3 servings daily.  Drink at least 1 glass of water with each meal and aim for at least 8 glasses (64 ounces) per day.  Exercise at least 150 minutes every week to the best of your ability.    Take Care,  Cobe Viney, PA-C

## 2022-06-28 LAB — INSULIN, RANDOM: Insulin: 4.9 u[IU]/mL

## 2022-06-28 LAB — THYROID PANEL WITH TSH
Free Thyroxine Index: 2.1 (ref 1.4–3.8)
T3 Uptake: 30 % (ref 22–35)
T4, Total: 7 ug/dL (ref 5.1–11.9)
TSH: 1.48 mIU/L

## 2022-06-29 ENCOUNTER — Other Ambulatory Visit: Payer: Self-pay | Admitting: Family Medicine

## 2022-06-29 DIAGNOSIS — Z1231 Encounter for screening mammogram for malignant neoplasm of breast: Secondary | ICD-10-CM

## 2022-06-29 LAB — CYTOLOGY - PAP
Adequacy: ABSENT
Comment: NEGATIVE
Diagnosis: HIGH — AB
High risk HPV: NEGATIVE

## 2022-07-01 ENCOUNTER — Other Ambulatory Visit: Payer: Self-pay

## 2022-07-01 ENCOUNTER — Encounter: Payer: Self-pay | Admitting: Physician Assistant

## 2022-07-01 DIAGNOSIS — R87619 Unspecified abnormal cytological findings in specimens from cervix uteri: Secondary | ICD-10-CM

## 2022-07-01 DIAGNOSIS — N289 Disorder of kidney and ureter, unspecified: Secondary | ICD-10-CM

## 2022-07-01 DIAGNOSIS — R7989 Other specified abnormal findings of blood chemistry: Secondary | ICD-10-CM

## 2022-07-04 ENCOUNTER — Other Ambulatory Visit: Payer: Self-pay

## 2022-07-04 DIAGNOSIS — N289 Disorder of kidney and ureter, unspecified: Secondary | ICD-10-CM

## 2022-07-04 DIAGNOSIS — R7989 Other specified abnormal findings of blood chemistry: Secondary | ICD-10-CM

## 2022-07-04 NOTE — Telephone Encounter (Signed)
Maria Pineda, is there anything you can do for patient to get her scheduled for this urgent referral before the end of the year?

## 2022-07-06 ENCOUNTER — Other Ambulatory Visit (HOSPITAL_COMMUNITY)
Admission: RE | Admit: 2022-07-06 | Discharge: 2022-07-06 | Disposition: A | Discharge status: Home / Self Care | Payer: 59 | Source: Ambulatory Visit | Attending: Obstetrics & Gynecology | Admitting: Obstetrics & Gynecology

## 2022-07-06 ENCOUNTER — Encounter: Payer: Self-pay | Admitting: Obstetrics & Gynecology

## 2022-07-06 ENCOUNTER — Ambulatory Visit (INDEPENDENT_AMBULATORY_CARE_PROVIDER_SITE_OTHER): Payer: 59 | Admitting: Obstetrics & Gynecology

## 2022-07-06 VITALS — BP 116/74 | Resp 16

## 2022-07-06 DIAGNOSIS — R87611 Atypical squamous cells cannot exclude high grade squamous intraepithelial lesion on cytologic smear of cervix (ASC-H): Secondary | ICD-10-CM

## 2022-07-06 NOTE — Progress Notes (Signed)
    Maria Pineda [birth year] [id number]        56 y.o.  [id number]   RP: Pap ASCUS, Cannot exclude HG SIL/HPV HR Neg for Colposcopy  HPI: Pap 06/27/22 ASCUS, Cannot exclude HG SIL/HPV HR Neg.  No STI screen done.  Abstinent x 2 years.  H/O LEEP/Cryo x 2/Laser of Cervix in the 1990's per patient.  Endometrial Ablation in 2015.  Postmenopause, no HRT.  No PMB.  No pelvic pain.   OB History  Gravida Para Term Preterm AB Living  4 2 2   2 2   SAB IAB Ectopic Multiple Live Births  2            # Outcome Date GA Lbr Len/2nd Weight Sex Delivery Anes PTL Lv  4 SAB           3 SAB           2 Term           1 Term             Past medical history,surgical history, problem list, medications, allergies, family history and social history were all reviewed and documented in the EPIC chart.   Directed ROS with pertinent positives and negatives documented in the history of present illness/assessment and plan.  Exam:  Vitals:   07/06/22 1432  BP: 116/74  Resp: 16  SpO2: 97%   General appearance:  Normal  Colposcopy Procedure Note Maria Pineda 07/06/2022  Indications: ASCUS cannot r/o HGSIL, HPV HR Negative 06/27/22  Procedure Details  The risks and benefits of the procedure and Written informed consent obtained.  Speculum placed in vagina and excellent visualization of cervix achieved, cervix swabbed x 3 with acetic acid solution.  Findings:  Cervix colposcopy: Physical Exam Genitourinary:       Vaginal colposcopy: Normal  Vulvar colposcopy: Normal  Perirectal colposcopy: Normal  The cervix was sprayed with Hurricane before performing the cervical biopsies.  Specimens: HPV HR, Gono-Chlam, Cervical Bx at 12 O'Clock, ECC.  Complications:  None, good hemostasis with Silver Nitrate . Plan:  Management per results   Assessment/Plan:  56 y.o. 56   1. Pap smear of cervix with ASCUS, cannot exclude HGSIL Pap 06/27/22 ASCUS, Cannot exclude HG SIL/HPV HR Neg.  No STI  screen done.  Abstinent x 2 years.  H/O LEEP/Cryo x 2/Laser of Cervix in the 1990's per patient.  Endometrial Ablation in 2015.  Postmenopause, no HRT.  No PMB.  No pelvic pain.  Pap results discussed with patient.  Colposcopy/EBx consents signed.  Colposcopy findings reviewed.  Post procedure precautions.  Management per results.  Given the stenotic endocervical canal and the Post endometrial ablation status, EBx was not possible.  Will f/u for a Pelvic 2016 to evaluate the endometrial line. - Surgical pathology( [place]/ [facility]) - Surgical pathology( [place]/ [facility]) - Cervicovaginal ancillary only( [place]) - US Transvaginal Non-OB; Future  Other orders - doxepin (SINEQUAN) 10 MG capsule; Take 10 mg by mouth at bedtime. - lamoTRIgine (LAMICTAL) 25 MG tablet; Take by mouth. - ibuprofen (ADVIL) 600 MG tablet; Take 600 mg by mouth every 6 (six) hours as needed. - acetaminophen-codeine (TYLENOL #3) 300-30 MG tablet; Take 1 tablet by mouth every 6 (six) hours as needed. - amoxicillin (AMOXIL) 500 MG capsule; Take 500 mg by mouth 3 (three) times daily.  [name] MD, 2:50 PM 07/06/2022

## 2022-07-08 ENCOUNTER — Ambulatory Visit (INDEPENDENT_AMBULATORY_CARE_PROVIDER_SITE_OTHER): Payer: 59 | Admitting: Physician Assistant

## 2022-07-08 ENCOUNTER — Encounter: Payer: Self-pay | Admitting: Physician Assistant

## 2022-07-08 VITALS — BP 118/84 | HR 89 | Temp 97.0°F | Ht 62.0 in | Wt 189.0 lb

## 2022-07-08 DIAGNOSIS — R87619 Unspecified abnormal cytological findings in specimens from cervix uteri: Secondary | ICD-10-CM | POA: Diagnosis not present

## 2022-07-08 DIAGNOSIS — M791 Myalgia, unspecified site: Secondary | ICD-10-CM | POA: Insufficient documentation

## 2022-07-08 DIAGNOSIS — R7989 Other specified abnormal findings of blood chemistry: Secondary | ICD-10-CM | POA: Diagnosis not present

## 2022-07-08 DIAGNOSIS — R944 Abnormal results of kidney function studies: Secondary | ICD-10-CM | POA: Diagnosis not present

## 2022-07-08 DIAGNOSIS — M545 Low back pain, unspecified: Secondary | ICD-10-CM | POA: Insufficient documentation

## 2022-07-08 LAB — SURGICAL PATHOLOGY

## 2022-07-08 NOTE — Progress Notes (Signed)
Subjective:    Patient ID: Maria Pineda, female    DOB: [birth year], 56 y.o.   MRN: [id number]  Chief Complaint  Patient presents with   Abnormal Pap Smear    Pt concerned with abnormal pap results, has seen GYN recently as well; pt also wanted to discuss sudden weight gain since October; didn't get very much information form GYN and wanted to discuss     HPI Patient is in today for recheck. Slightly elevated platelet count on recent CBC and slightly decreased GFR on CMP. No symptoms.   Recent GYN visit 07/06/22 with Dr. Seymour Bars - colposcopy performed. She will have ultrasound done in two weeks to check endometrial lining.   Past Medical History:  Diagnosis Date   Abnormal Pap smear of cervix    Anxiety Ongoing   Bipolar 1 disorder ([facility])    Depression Ongoing   Substance abuse ([facility]) 2005   In NA -clean 6 month   Suicide attempt ([facility]) 2020   Well woman exam with routine gynecological exam 06/27/2022    Past Surgical History:  Procedure Laterality Date   BREAST SURGERY  Augmentation   CERVICAL BIOPSY  W/ LOOP ELECTRODE EXCISION     CESAREAN SECTION  4/02   COSMETIC SURGERY  6/96, 6/16   Breast augment   CRYOTHERAPY     abnormal pap smear   EYE SURGERY  08/1998   Lasik - eyes reverted after a few years/wear hard contact lenses   SPINE SURGERY  10/12   Fuse C4/5    Family History  Problem Relation Age of Onset   Arthritis Mother    Cancer Mother    Lung cancer Mother    Anxiety disorder Father    Cancer Father    Depression Father    Prostate cancer Father    ADD / ADHD Brother    Drug abuse Brother    Heart attack Maternal Grandfather    Heart attack Paternal Grandfather     Social History   Tobacco Use   Smoking status: Some Days    Packs/day: 0.25    Years: 15.00    Total pack years: 3.75    Types: Cigarettes, E-cigarettes    Last attempt to quit: 12/30/2020    Years since quitting: 1.5   Tobacco comments:    Quit cigarettes in 2022. Still use e-cigs  occasionally 2023  Vaping Use   Vaping Use: Some days   Substances: Nicotine, Flavoring  Substance Use Topics   Alcohol use: Not Currently   Drug use: Not Currently    Types: Amphetamines, Benzodiazepines, Cocaine, Codeine, Hydrocodone, Marijuana, Methamphetamines    Comment: In NA and clean working program w/sponsor     No Known Allergies  Review of Systems NEGATIVE UNLESS OTHERWISE INDICATED IN HPI      Objective:     BP 118/84   Pulse 89   Temp (!) 97 F (36.1 C) (Temporal)   Ht 5\' 2"  (1.575 m)   Wt 189 lb (85.7 kg)   SpO2 95%   BMI 34.57 kg/m   Wt Readings from Last 3 Encounters:  07/08/22 189 lb (85.7 kg)  06/27/22 184 lb 9.6 oz (83.7 kg)    BP Readings from Last 3 Encounters:  07/08/22 118/84  07/06/22 116/74  06/27/22 128/84     Physical Exam Constitutional:      Appearance: Normal appearance. She is obese.  Cardiovascular:     Rate and Rhythm: Normal rate and regular rhythm.  Pulmonary:  Effort: Pulmonary effort is normal.  Neurological:     General: No focal deficit present.     Mental Status: She is alert.  Psychiatric:        Mood and Affect: Mood normal.        Assessment & Plan:  Decreased GFR -     Basic metabolic panel; Future  Elevated platelet count -     CBC with Differential/Platelet; Future  Abnormal cervical Papanicolaou smear, unspecified abnormal pap finding    Patient doing well.  She had recent visit with gynecology for a colposcopy and will follow-up in a few weeks to review results and also have an ultrasound performed.  Recent labs showed decreased GFR and elevated platelet count.  I encouraged her to stay well-hydrated.  She avoids NSAIDs.  Per review of labs on her phone from recent years, it looks like slightly elevated platelet count has been normal for her.  We will recheck CBC and BMP in a few weeks.  Otherwise encouraged her to keep up the good work with trying to work on nutrition and exercise.    Return  if symptoms worsen or fail to improve.  This note was prepared with assistance of Systems analyst. Occasional wrong-word or sound-a-like substitutions may have occurred due to the inherent limitations of voice recognition software.     Tamari Redwine M Janisa Labus, PA-C

## 2022-07-12 LAB — CERVICOVAGINAL ANCILLARY ONLY
Chlamydia: NEGATIVE
Comment: NEGATIVE
Comment: NEGATIVE
Comment: NORMAL
High risk HPV: NEGATIVE
Neisseria Gonorrhea: NEGATIVE

## 2022-07-18 ENCOUNTER — Other Ambulatory Visit: Payer: Self-pay | Admitting: *Deleted

## 2022-07-19 ENCOUNTER — Telehealth: Payer: Self-pay | Admitting: *Deleted

## 2022-07-19 NOTE — Telephone Encounter (Signed)
-----   Message from Genia Del, MD sent at 07/17/2022  9:36 AM EST ----- HR HPV Neg.  Gono-Chlam Neg.  Colpo Cerv Bx Mild dysplasia/CIN 1.  ECC Mild dysplasia/CIN 1, no endocervical mucosa present.  Repeat Pap in 6 months.

## 2022-07-19 NOTE — Telephone Encounter (Signed)
Patient informed asked if pelvic ultrasound still needed? She is scheduled on 07/28/22. Please advise

## 2022-07-20 NOTE — Telephone Encounter (Signed)
Left message for patient to call.

## 2022-07-21 NOTE — Telephone Encounter (Signed)
Spoke with patient. Patient aware to come in for U/S

## 2022-07-26 ENCOUNTER — Other Ambulatory Visit (INDEPENDENT_AMBULATORY_CARE_PROVIDER_SITE_OTHER): Payer: 59

## 2022-07-26 DIAGNOSIS — R944 Abnormal results of kidney function studies: Secondary | ICD-10-CM

## 2022-07-26 DIAGNOSIS — R7989 Other specified abnormal findings of blood chemistry: Secondary | ICD-10-CM

## 2022-07-26 LAB — BASIC METABOLIC PANEL
BUN: 20 mg/dL (ref 6–23)
CO2: 26 mEq/L (ref 19–32)
Calcium: 10.3 mg/dL (ref 8.4–10.5)
Chloride: 99 mEq/L (ref 96–112)
Creatinine, Ser: 1.25 mg/dL — ABNORMAL HIGH (ref 0.40–1.20)
GFR: 48.95 mL/min — ABNORMAL LOW (ref >60.00)
Glucose, Bld: 118 mg/dL — ABNORMAL HIGH (ref 70–99)
Potassium: 4.5 mEq/L (ref 3.5–5.1)
Sodium: 135 mEq/L (ref 135–145)

## 2022-07-26 LAB — CBC WITH DIFFERENTIAL/PLATELET
Basophils Absolute: 0.1 10*3/uL (ref 0.0–0.1)
Basophils Relative: 0.7 % (ref 0.0–3.0)
Eosinophils Absolute: 0.2 10*3/uL (ref 0.0–0.7)
Eosinophils Relative: 2.1 % (ref 0.0–5.0)
HCT: 41.8 % (ref 36.0–46.0)
Hemoglobin: 13.9 g/dL (ref 12.0–15.0)
Lymphocytes Relative: 22.2 % (ref 12.0–46.0)
Lymphs Abs: 2.2 10*3/uL (ref 0.7–4.0)
MCHC: 33.2 g/dL (ref 30.0–36.0)
MCV: 88.4 fl (ref 78.0–100.0)
Monocytes Absolute: 0.8 10*3/uL (ref 0.1–1.0)
Monocytes Relative: 8.1 % (ref 3.0–12.0)
Neutro Abs: 6.6 10*3/uL (ref 1.4–7.7)
Neutrophils Relative %: 66.9 % (ref 43.0–77.0)
Platelets: 476 10*3/uL — ABNORMAL HIGH (ref 150.0–400.0)
RBC: 4.73 Mil/uL (ref 3.87–5.11)
RDW: 13.6 % (ref 11.5–15.5)
WBC: 9.9 10*3/uL (ref 4.0–10.5)

## 2022-07-28 ENCOUNTER — Ambulatory Visit (INDEPENDENT_AMBULATORY_CARE_PROVIDER_SITE_OTHER): Payer: 59 | Admitting: Physician Assistant

## 2022-07-28 ENCOUNTER — Encounter: Payer: Self-pay | Admitting: Physician Assistant

## 2022-07-28 ENCOUNTER — Ambulatory Visit (INDEPENDENT_AMBULATORY_CARE_PROVIDER_SITE_OTHER): Payer: 59

## 2022-07-28 ENCOUNTER — Other Ambulatory Visit: Payer: Self-pay | Admitting: Obstetrics & Gynecology

## 2022-07-28 ENCOUNTER — Other Ambulatory Visit: Payer: 59 | Admitting: Obstetrics & Gynecology

## 2022-07-28 ENCOUNTER — Ambulatory Visit: Payer: 59

## 2022-07-28 ENCOUNTER — Other Ambulatory Visit (INDEPENDENT_AMBULATORY_CARE_PROVIDER_SITE_OTHER): Payer: 59

## 2022-07-28 VITALS — BP 138/80 | HR 85 | Temp 98.3°F | Ht 62.0 in | Wt 185.4 lb

## 2022-07-28 DIAGNOSIS — F319 Bipolar disorder, unspecified: Secondary | ICD-10-CM

## 2022-07-28 DIAGNOSIS — R87611 Atypical squamous cells cannot exclude high grade squamous intraepithelial lesion on cytologic smear of cervix (ASC-H): Secondary | ICD-10-CM

## 2022-07-28 DIAGNOSIS — N1831 Chronic kidney disease, stage 3a: Secondary | ICD-10-CM | POA: Diagnosis not present

## 2022-07-28 DIAGNOSIS — R739 Hyperglycemia, unspecified: Secondary | ICD-10-CM | POA: Diagnosis not present

## 2022-07-28 LAB — URINALYSIS, ROUTINE W REFLEX MICROSCOPIC
Bilirubin Urine: NEGATIVE
Hgb urine dipstick: NEGATIVE
Ketones, ur: NEGATIVE
Nitrite: NEGATIVE
RBC / HPF: NONE SEEN (ref 0–?)
Specific Gravity, Urine: 1.01 (ref 1.000–1.030)
Total Protein, Urine: NEGATIVE
Urine Glucose: NEGATIVE
Urobilinogen, UA: 0.2 (ref 0.0–1.0)
pH: 7 (ref 5.0–8.0)

## 2022-07-28 LAB — POCT GLYCOSYLATED HEMOGLOBIN (HGB A1C): HbA1c POC (<> result, manual entry): 5.1 % (ref 4.0–5.6)

## 2022-07-28 LAB — MICROALBUMIN / CREATININE URINE RATIO
Creatinine,U: 26.8 mg/dL
Microalb Creat Ratio: 2.6 mg/g (ref 0.0–30.0)
Microalb, Ur: <0.7 mg/dL (ref 0.0–1.9)

## 2022-07-28 NOTE — Progress Notes (Addendum)
Subjective:    Patient ID: Maria Pineda, female    DOB: [birth year], 56 y.o.   MRN: [id number]  Chief Complaint  Patient presents with   lab results    Pt is here to discuss labs and possible retesting.   Follow-up    HPI Patient is in today for discussion about recent blood work.  Her GFR is noticeably lower than previous results 2 years ago.  She has never been told that she has any issues with her kidneys. She does not have issues with hypertension or diabetes.  Her history is positive for polysubstance abuse, clean for 10 months per patient, never any IV drug use. She does not have a history of hepatitis or HIV.  Her lithium has been monitored through her psychiatrist and patient reports this level has been normal, most recently checked about 1 month ago.  Patient does not drink alcohol.  States she drinks about 10 glasses of water daily.  Has not noticed any changes to her urine.  Patient states that she does find herself more thirsty and this has been attributed to the lithium.   Past Medical History:  Diagnosis Date   Abnormal Pap smear of cervix    Anxiety Ongoing   Bipolar 1 disorder ([facility])    Depression Ongoing   Substance abuse ([facility]) 2005   In NA -clean 6 month   Suicide attempt ([facility]) 2020   Well woman exam with routine gynecological exam 06/27/2022    Past Surgical History:  Procedure Laterality Date   BREAST SURGERY  Augmentation   CERVICAL BIOPSY  W/ LOOP ELECTRODE EXCISION     CESAREAN SECTION  4/02   COSMETIC SURGERY  6/96, 6/16   Breast augment   CRYOTHERAPY     abnormal pap smear   EYE SURGERY  08/1998   Lasik - eyes reverted after a few years/wear hard contact lenses   SPINE SURGERY  10/12   Fuse C4/5    Family History  Problem Relation Age of Onset   Arthritis Mother    Cancer Mother    Lung cancer Mother    Anxiety disorder Father    Cancer Father    Depression Father    Prostate cancer Father    ADD / ADHD Brother    Drug abuse Brother    Heart  attack Maternal Grandfather    Heart attack Paternal Grandfather     Social History   Tobacco Use   Smoking status: Some Days    Packs/day: 0.25    Years: 15.00    Total pack years: 3.75    Types: Cigarettes, E-cigarettes    Last attempt to quit: 12/30/2020    Years since quitting: 1.5   Tobacco comments:    Quit cigarettes in 2022. Still use e-cigs occasionally 2023  Vaping Use   Vaping Use: Some days   Substances: Nicotine, Flavoring  Substance Use Topics   Alcohol use: Not Currently   Drug use: Not Currently    Types: Amphetamines, Benzodiazepines, Cocaine, Codeine, Hydrocodone, Marijuana, Methamphetamines    Comment: In NA and clean working program w/sponsor     No Known Allergies  Review of Systems NEGATIVE UNLESS OTHERWISE INDICATED IN HPI      Objective:     BP 138/80   Pulse 85   Temp 98.3 F (36.8 C)   Ht 5\' 2"  (1.575 m)   Wt 185 lb 6.4 oz (84.1 kg)   SpO2 98%   BMI 33.91 kg/m  Wt Readings from Last 3 Encounters:  07/28/22 185 lb 6.4 oz (84.1 kg)  07/08/22 189 lb (85.7 kg)  06/27/22 184 lb 9.6 oz (83.7 kg)    BP Readings from Last 3 Encounters:  07/28/22 138/80  07/08/22 118/84  07/06/22 116/74     Physical Exam Vitals and nursing note reviewed.  Constitutional:      Appearance: Normal appearance. She is obese.  Cardiovascular:     Rate and Rhythm: Normal rate and regular rhythm.     Pulses: Normal pulses.     Heart sounds: No murmur heard. Pulmonary:     Effort: Pulmonary effort is normal.     Breath sounds: Normal breath sounds.  Abdominal:     Tenderness: There is no right CVA tenderness or left CVA tenderness.  Neurological:     General: No focal deficit present.     Mental Status: She is alert and oriented to person, place, and time.  Psychiatric:        Mood and Affect: Mood normal.        Assessment & Plan:  Chronic kidney disease, stage 3a ([facility]CC) -     Microalbumin / creatinine urine ratio -     Urinalysis, Routine w  reflex microscopic  Hyperglycemia -     POCT glycosylated hemoglobin (Hb A1C)  Bipolar 1 disorder ([facility])   I personally reviewed patient's most recent blood work with her.  Her GFR from 1 month ago was 55.29; now her GFR is 48.95 from 2 days ago.  Her creatinine level was 1.25.  Glucose was slightly elevated at 118 as well.  Hemoglobin A1c point-of-care was checked in office today and patient was reassured she does not have diabetes.  Her A1c was 5.1%.  Plan to check microalbumin/creatinine urine ratio and urinalysis with routine reflex today and go from there.  Will need to monitor kidney function closely, especially with patient being on lithium for her bipolar disorder.  She will routinely have her lithium levels checked with her psychiatrist.  Discussed ways patient can help reduce harm to her kidneys.  This was also printed in AVS for her.     Return in about 3 months (around 10/27/2022) for recheck .  This note was prepared with assistance of Conservation officer, historic buildings. Occasional wrong-word or sound-a-like substitutions may have occurred due to the inherent limitations of voice recognition software.    Chesney Klimaszewski M Danelia Snodgrass, PA-C

## 2022-08-08 DIAGNOSIS — M791 Myalgia, unspecified site: Secondary | ICD-10-CM | POA: Diagnosis not present

## 2022-08-08 DIAGNOSIS — M47816 Spondylosis without myelopathy or radiculopathy, lumbar region: Secondary | ICD-10-CM | POA: Diagnosis not present

## 2022-08-08 DIAGNOSIS — M545 Low back pain, unspecified: Secondary | ICD-10-CM | POA: Diagnosis not present

## 2022-08-09 ENCOUNTER — Ambulatory Visit: Payer: 59 | Admitting: Obstetrics & Gynecology

## 2022-08-18 ENCOUNTER — Ambulatory Visit: Payer: 59 | Admitting: Obstetrics & Gynecology

## 2022-08-19 ENCOUNTER — Encounter: Payer: Self-pay | Admitting: Obstetrics & Gynecology

## 2022-08-22 NOTE — Telephone Encounter (Signed)
Dr. Dellis Filbert -please review 07/28/22 PUS results.  Is this something that can be called to patient by triage?

## 2022-08-23 DIAGNOSIS — F1490 Cocaine use, unspecified, uncomplicated: Secondary | ICD-10-CM | POA: Diagnosis not present

## 2022-08-23 DIAGNOSIS — F419 Anxiety disorder, unspecified: Secondary | ICD-10-CM | POA: Diagnosis not present

## 2022-08-23 DIAGNOSIS — T43655A Adverse effect of methamphetamines, initial encounter: Secondary | ICD-10-CM | POA: Diagnosis not present

## 2022-08-23 DIAGNOSIS — F3132 Bipolar disorder, current episode depressed, moderate: Secondary | ICD-10-CM | POA: Diagnosis not present

## 2022-08-25 ENCOUNTER — Ambulatory Visit
Admission: RE | Admit: 2022-08-25 | Discharge: 2022-08-25 | Disposition: A | Discharge status: Home / Self Care | Payer: 59 | Source: Ambulatory Visit | Attending: Family Medicine | Admitting: Family Medicine

## 2022-08-25 DIAGNOSIS — Z1231 Encounter for screening mammogram for malignant neoplasm of breast: Secondary | ICD-10-CM | POA: Diagnosis not present

## 2022-08-25 NOTE — Telephone Encounter (Signed)
Call returned to patient, Left message to call [facility] Triage at [phone], OPT 4.  OK to cancel future OV once results reviewed, unless patient wants to further discuss with provider.   Routing to [employer].

## 2022-08-25 NOTE — Telephone Encounter (Signed)
Maria Bruins, MD  You16 hours ago (4:38 PM)    I agree with patient, completely reassuring pelvic US.  No need to come for a visit.  Please call patient and transmit her that I am sorry about the difficult communications.  T/V and T/A images.  Small anteverted uterus with a single small 1 cm fibroid pedunculated to the right from the lower uterine segment.  The overall uterine size is measured at 5.03 x 3.18 x 3.34 cm.  Thin and symmetrical endometrium with no mass or thickening or fluid collection seen, the endometrial lining is measured at 2.52 mm.  The endometrial lining is best seen transabdominally.  Both ovaries are small with atrophic appearance.  The left ovary is only visible transabdominally.  No adnexal mass.  No free fluid in the pelvis.  Dr Dellis Filbert

## 2022-08-26 ENCOUNTER — Other Ambulatory Visit (INDEPENDENT_AMBULATORY_CARE_PROVIDER_SITE_OTHER): Payer: 59

## 2022-08-26 ENCOUNTER — Other Ambulatory Visit: Payer: 59

## 2022-08-26 DIAGNOSIS — N289 Disorder of kidney and ureter, unspecified: Secondary | ICD-10-CM

## 2022-08-26 LAB — COMPREHENSIVE METABOLIC PANEL
ALT: 22 U/L (ref 0–35)
AST: 19 U/L (ref 0–37)
Albumin: 4.3 g/dL (ref 3.5–5.2)
Alkaline Phosphatase: 112 U/L (ref 39–117)
BUN: 18 mg/dL (ref 6–23)
CO2: 26 mEq/L (ref 19–32)
Calcium: 9.4 mg/dL (ref 8.4–10.5)
Chloride: 101 mEq/L (ref 96–112)
Creatinine, Ser: 1.1 mg/dL (ref 0.40–1.20)
GFR: 57.04 mL/min — ABNORMAL LOW (ref >60.00)
Glucose, Bld: 106 mg/dL — ABNORMAL HIGH (ref 70–99)
Potassium: 4.7 mEq/L (ref 3.5–5.1)
Sodium: 135 mEq/L (ref 135–145)
Total Bilirubin: 0.3 mg/dL (ref 0.2–1.2)
Total Protein: 6.7 g/dL (ref 6.0–8.3)

## 2022-08-29 ENCOUNTER — Encounter: Payer: Self-pay | Admitting: Physician Assistant

## 2022-08-30 NOTE — Telephone Encounter (Signed)
Please see pt message and advise 

## 2022-09-01 NOTE — Telephone Encounter (Signed)
Encounter closed

## 2022-09-01 NOTE — Telephone Encounter (Signed)
Pt advised/voiced understanding. Recall placed in chart for f/u 6 month pap per path notes from colpo done in 07/2022.

## 2022-09-05 DIAGNOSIS — M545 Low back pain, unspecified: Secondary | ICD-10-CM | POA: Diagnosis not present

## 2022-09-05 DIAGNOSIS — M9905 Segmental and somatic dysfunction of pelvic region: Secondary | ICD-10-CM | POA: Diagnosis not present

## 2022-09-05 DIAGNOSIS — M9903 Segmental and somatic dysfunction of lumbar region: Secondary | ICD-10-CM | POA: Diagnosis not present

## 2022-09-05 DIAGNOSIS — M9904 Segmental and somatic dysfunction of sacral region: Secondary | ICD-10-CM | POA: Diagnosis not present

## 2022-09-06 DIAGNOSIS — M791 Myalgia, unspecified site: Secondary | ICD-10-CM | POA: Diagnosis not present

## 2022-09-06 DIAGNOSIS — M545 Low back pain, unspecified: Secondary | ICD-10-CM | POA: Diagnosis not present

## 2022-09-06 DIAGNOSIS — M47816 Spondylosis without myelopathy or radiculopathy, lumbar region: Secondary | ICD-10-CM | POA: Diagnosis not present

## 2022-09-08 ENCOUNTER — Encounter: Payer: Self-pay | Admitting: Physician Assistant

## 2022-09-08 ENCOUNTER — Ambulatory Visit (INDEPENDENT_AMBULATORY_CARE_PROVIDER_SITE_OTHER): Payer: 59 | Admitting: Physician Assistant

## 2022-09-08 VITALS — BP 140/108 | HR 105 | Ht 62.0 in | Wt 185.0 lb

## 2022-09-08 DIAGNOSIS — M47816 Spondylosis without myelopathy or radiculopathy, lumbar region: Secondary | ICD-10-CM | POA: Diagnosis not present

## 2022-09-08 DIAGNOSIS — Z6833 Body mass index (BMI) 33.0-33.9, adult: Secondary | ICD-10-CM

## 2022-09-08 DIAGNOSIS — E6609 Other obesity due to excess calories: Secondary | ICD-10-CM

## 2022-09-08 DIAGNOSIS — N1831 Chronic kidney disease, stage 3a: Secondary | ICD-10-CM | POA: Diagnosis not present

## 2022-09-08 NOTE — Progress Notes (Signed)
Subjective:    Patient ID: Maria Pineda, female    DOB: 24-Sep-1965, 57 y.o.   MRN: [id number]  Chief Complaint  Patient presents with   Chronic Kidney Disease    Follow up from labs on 1/26, wanting to discuss if there is any other tests that can be ordered for better treatment and or diagnoses     HPI Patient is in today for discussion about labs, CKD, weight concerns, chronic low back pain.  Past Medical History:  Diagnosis Date   Abnormal Pap smear of cervix    Anxiety Ongoing   Bipolar 1 disorder ([place])    Depression Ongoing   Substance abuse ([place]) 2005   In NA -clean 6 month   Suicide attempt ([place]) 2020   Well woman exam with routine gynecological exam 06/27/2022    Past Surgical History:  Procedure Laterality Date   BREAST SURGERY  Augmentation   CERVICAL BIOPSY  W/ LOOP ELECTRODE EXCISION     CESAREAN SECTION  4/02   COSMETIC SURGERY  6/96, 6/16   Breast augment   CRYOTHERAPY     abnormal pap smear   EYE SURGERY  08/1998   Lasik - eyes reverted after a few years/wear hard contact lenses   SPINE SURGERY  10/12   Fuse C4/5    Family History  Problem Relation Age of Onset   Arthritis Mother    Cancer Mother    Lung cancer Mother    Anxiety disorder Father    Cancer Father    Depression Father    Prostate cancer Father    ADD / ADHD Brother    Drug abuse Brother    Heart attack Maternal Grandfather    Heart attack Paternal Grandfather     Social History   Tobacco Use   Smoking status: Some Days    Packs/day: 0.25    Years: 15.00    Total pack years: 3.75    Types: Cigarettes, E-cigarettes    Last attempt to quit: 12/30/2020    Years since quitting: 1.7   Tobacco comments:    Quit cigarettes in 2022. Still use e-cigs occasionally 2023  Vaping Use   Vaping Use: Some days   Substances: Nicotine, Flavoring  Substance Use Topics   Alcohol use: Not Currently   Drug use: Not Currently    Types: Amphetamines, Benzodiazepines, Cocaine, Codeine,  Hydrocodone, Marijuana, Methamphetamines    Comment: In NA and clean working program w/sponsor     No Known Allergies  Review of Systems NEGATIVE UNLESS OTHERWISE INDICATED IN HPI      Objective:     BP (!) 140/108   Pulse (!) 105   Ht 5' 2"$  (1.575 m)   Wt 185 lb (83.9 kg)   SpO2 98%   BMI 33.84 kg/m   Wt Readings from Last 3 Encounters:  09/08/22 185 lb (83.9 kg)  07/28/22 185 lb 6.4 oz (84.1 kg)  07/08/22 189 lb (85.7 kg)    BP Readings from Last 3 Encounters:  09/08/22 (!) 140/108  07/28/22 138/80  07/08/22 118/84     Physical Exam Vitals and nursing note reviewed.  Constitutional:      Appearance: Normal appearance. She is obese.  Cardiovascular:     Rate and Rhythm: Normal rate and regular rhythm.  Pulmonary:     Effort: Pulmonary effort is normal.     Breath sounds: Normal breath sounds.  Neurological:     General: No focal deficit present.     Mental  Status: She is alert.  Psychiatric:        Mood and Affect: Mood normal.        Behavior: Behavior normal.        Assessment & Plan:  Chronic kidney disease, stage 3a (HCC) Assessment & Plan: Last GFR was 57.04 mL/min Talked with patient about keep BP stable, to goal of 140/90 or less Keep well hydrated with water daily Psych stopped her lithium Gave handouts on nutrition and OTC meds for CKD Will monitor trends I don't think we need nephrology consult at this time   Class 1 obesity due to excess calories without serious comorbidity with body mass index (BMI) of 33.0 to 33.9 in adult Assessment & Plan: Pt very motivated to work on health changes. Referral placed to [facility].   Orders: -     Amb Ref to Medical Weight Management  Spondylosis of lumbar spine Assessment & Plan: MRI L spine: 1.  Remote compression deformity of L3.  2.  Moderate canal stenosis at L4-L5 and moderate bilateral neural foraminal stenosis at L3-L4 and L4-L5. Additional mild degenerative changes are further detailed  above.   She has been following with [facility], would like different opinion. Placed referral to Dr. Marcello Moores for her today.   Orders: -     Ambulatory referral to Neurosurgery       Return in about 5 months (around 02/06/2023) for recheck fasting labs, med check .   Bharat Antillon M Lorrine Killilea, PA-C

## 2022-09-12 ENCOUNTER — Ambulatory Visit: Payer: 59 | Admitting: Obstetrics & Gynecology

## 2022-09-12 DIAGNOSIS — Z6833 Body mass index (BMI) 33.0-33.9, adult: Secondary | ICD-10-CM | POA: Diagnosis not present

## 2022-09-12 DIAGNOSIS — S32030G Wedge compression fracture of third lumbar vertebra, subsequent encounter for fracture with delayed healing: Secondary | ICD-10-CM | POA: Diagnosis not present

## 2022-09-12 NOTE — Assessment & Plan Note (Signed)
MRI L spine: 1.  Remote compression deformity of L3.  2.  Moderate canal stenosis at L4-L5 and moderate bilateral neural foraminal stenosis at L3-L4 and L4-L5. Additional mild degenerative changes are further detailed above.   She has been following with [facility], would like different opinion. Placed referral to Dr. Marcello Moores for her today.

## 2022-09-12 NOTE — Assessment & Plan Note (Signed)
Last GFR was 57.04 mL/min Talked with patient about keep BP stable, to goal of 140/90 or less Keep well hydrated with water daily Psych stopped her lithium Gave handouts on nutrition and OTC meds for CKD Will monitor trends I don't think we need nephrology consult at this time

## 2022-09-12 NOTE — Assessment & Plan Note (Signed)
Pt very motivated to work on health changes. Referral placed to [facility].

## 2022-09-13 DIAGNOSIS — M9904 Segmental and somatic dysfunction of sacral region: Secondary | ICD-10-CM | POA: Diagnosis not present

## 2022-09-13 DIAGNOSIS — M545 Low back pain, unspecified: Secondary | ICD-10-CM | POA: Diagnosis not present

## 2022-09-13 DIAGNOSIS — M9905 Segmental and somatic dysfunction of pelvic region: Secondary | ICD-10-CM | POA: Diagnosis not present

## 2022-09-13 DIAGNOSIS — M9903 Segmental and somatic dysfunction of lumbar region: Secondary | ICD-10-CM | POA: Diagnosis not present

## 2022-09-19 ENCOUNTER — Ambulatory Visit (INDEPENDENT_AMBULATORY_CARE_PROVIDER_SITE_OTHER): Payer: 59 | Admitting: Behavioral Health

## 2022-09-19 DIAGNOSIS — F1510 Other stimulant abuse, uncomplicated: Secondary | ICD-10-CM | POA: Diagnosis not present

## 2022-09-19 DIAGNOSIS — F1994 Other psychoactive substance use, unspecified with psychoactive substance-induced mood disorder: Secondary | ICD-10-CM | POA: Diagnosis not present

## 2022-09-19 NOTE — Progress Notes (Signed)
                Novia Lansberry L Aries Kasa, LMFT 
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
## 2022-09-20 DIAGNOSIS — T43655A Adverse effect of methamphetamines, initial encounter: Secondary | ICD-10-CM | POA: Diagnosis not present

## 2022-09-20 DIAGNOSIS — M9905 Segmental and somatic dysfunction of pelvic region: Secondary | ICD-10-CM | POA: Diagnosis not present

## 2022-09-20 DIAGNOSIS — F1490 Cocaine use, unspecified, uncomplicated: Secondary | ICD-10-CM | POA: Diagnosis not present

## 2022-09-20 DIAGNOSIS — M545 Low back pain, unspecified: Secondary | ICD-10-CM | POA: Diagnosis not present

## 2022-09-20 DIAGNOSIS — F3132 Bipolar disorder, current episode depressed, moderate: Secondary | ICD-10-CM | POA: Diagnosis not present

## 2022-09-20 DIAGNOSIS — M9904 Segmental and somatic dysfunction of sacral region: Secondary | ICD-10-CM | POA: Diagnosis not present

## 2022-09-20 DIAGNOSIS — F419 Anxiety disorder, unspecified: Secondary | ICD-10-CM | POA: Diagnosis not present

## 2022-09-20 DIAGNOSIS — M9903 Segmental and somatic dysfunction of lumbar region: Secondary | ICD-10-CM | POA: Diagnosis not present

## 2022-09-22 DIAGNOSIS — S32030G Wedge compression fracture of third lumbar vertebra, subsequent encounter for fracture with delayed healing: Secondary | ICD-10-CM | POA: Diagnosis not present

## 2022-09-22 DIAGNOSIS — M5126 Other intervertebral disc displacement, lumbar region: Secondary | ICD-10-CM | POA: Diagnosis not present

## 2022-09-27 DIAGNOSIS — M545 Low back pain, unspecified: Secondary | ICD-10-CM | POA: Diagnosis not present

## 2022-09-27 DIAGNOSIS — M9905 Segmental and somatic dysfunction of pelvic region: Secondary | ICD-10-CM | POA: Diagnosis not present

## 2022-09-27 DIAGNOSIS — M9903 Segmental and somatic dysfunction of lumbar region: Secondary | ICD-10-CM | POA: Diagnosis not present

## 2022-09-27 DIAGNOSIS — M9904 Segmental and somatic dysfunction of sacral region: Secondary | ICD-10-CM | POA: Diagnosis not present

## 2022-09-30 DIAGNOSIS — Z6834 Body mass index (BMI) 34.0-34.9, adult: Secondary | ICD-10-CM | POA: Diagnosis not present

## 2022-09-30 DIAGNOSIS — M48062 Spinal stenosis, lumbar region with neurogenic claudication: Secondary | ICD-10-CM | POA: Diagnosis not present

## 2022-09-30 DIAGNOSIS — M4727 Other spondylosis with radiculopathy, lumbosacral region: Secondary | ICD-10-CM | POA: Diagnosis not present

## 2022-09-30 DIAGNOSIS — S32030G Wedge compression fracture of third lumbar vertebra, subsequent encounter for fracture with delayed healing: Secondary | ICD-10-CM | POA: Diagnosis not present

## 2022-10-04 DIAGNOSIS — M545 Low back pain, unspecified: Secondary | ICD-10-CM | POA: Diagnosis not present

## 2022-10-04 DIAGNOSIS — M9905 Segmental and somatic dysfunction of pelvic region: Secondary | ICD-10-CM | POA: Diagnosis not present

## 2022-10-04 DIAGNOSIS — M9903 Segmental and somatic dysfunction of lumbar region: Secondary | ICD-10-CM | POA: Diagnosis not present

## 2022-10-04 DIAGNOSIS — M9904 Segmental and somatic dysfunction of sacral region: Secondary | ICD-10-CM | POA: Diagnosis not present

## 2022-10-06 ENCOUNTER — Ambulatory Visit: Payer: 59 | Admitting: Behavioral Health

## 2022-10-06 DIAGNOSIS — F1010 Alcohol abuse, uncomplicated: Secondary | ICD-10-CM | POA: Diagnosis not present

## 2022-10-06 DIAGNOSIS — F1590 Other stimulant use, unspecified, uncomplicated: Secondary | ICD-10-CM | POA: Diagnosis not present

## 2022-10-06 DIAGNOSIS — F1510 Other stimulant abuse, uncomplicated: Secondary | ICD-10-CM

## 2022-10-06 DIAGNOSIS — F1994 Other psychoactive substance use, unspecified with psychoactive substance-induced mood disorder: Secondary | ICD-10-CM

## 2022-10-06 NOTE — Progress Notes (Signed)
[place] Counselor/Therapist Progress Note  Patient ID: Maria Pineda, MRN: [id number],    Date: 10/06/2022  Time Spent: 43 min Caregility; Pt is in [employer] in private & Provider @ working remotely from [employer] Type: Individual Therapy  Reported Symptoms: Elevated anx/dep   Mental Status Exam: Appearance:  Casual     Behavior: Appropriate and Sharing  Motor: Normal  Speech/Language:  Clear and Coherent  Affect: Appropriate  Mood: normal  Thought process: normal  Thought content:   WNL  Sensory/Perceptual disturbances:   WNL  Orientation: oriented to person, place, and time/date  Attention: Good  Concentration: Good  Memory: WNL  Fund of knowledge:  Good  Insight:   Good  Judgment:  Good  Impulse Control: Good   Risk Assessment: Danger to Self:  No Self-injurious Behavior: No Danger to Others: No Duty to Warn:no Physical Aggression / Violence:No  Access to Firearms a concern: No  Gang Involvement:No   Subjective: Pt is trying to take life one day @ a time. She is in [place] according to her scheduled times for Rx Testing & PO visits.   Interventions: Solution-Oriented/Positive Psychology  Diagnosis:Substance induced mood disorder ([place])  Stimulant abuse ([place])  Stimulant use disorder  Alcohol abuse  Plan: Maria Pineda is doing well today. She is managing her protocol for [place] well & attending her NA Support Mtgs. Relapse Prevention work done today will cont until next visit.  Target Date: 11/06/2022  Progress: 5  Frequency: Twice monthly  Modality: Boykin Reaper, LMFT

## 2022-10-06 NOTE — Progress Notes (Signed)
                Myrla Malanowski L Meyer Arora, LMFT 

## 2022-10-11 DIAGNOSIS — M545 Low back pain, unspecified: Secondary | ICD-10-CM | POA: Diagnosis not present

## 2022-10-11 DIAGNOSIS — M9903 Segmental and somatic dysfunction of lumbar region: Secondary | ICD-10-CM | POA: Diagnosis not present

## 2022-10-11 DIAGNOSIS — M9904 Segmental and somatic dysfunction of sacral region: Secondary | ICD-10-CM | POA: Diagnosis not present

## 2022-10-11 DIAGNOSIS — M9905 Segmental and somatic dysfunction of pelvic region: Secondary | ICD-10-CM | POA: Diagnosis not present

## 2022-10-12 DIAGNOSIS — M4727 Other spondylosis with radiculopathy, lumbosacral region: Secondary | ICD-10-CM | POA: Diagnosis not present

## 2022-10-12 DIAGNOSIS — M5416 Radiculopathy, lumbar region: Secondary | ICD-10-CM | POA: Diagnosis not present

## 2022-10-18 DIAGNOSIS — F1490 Cocaine use, unspecified, uncomplicated: Secondary | ICD-10-CM | POA: Diagnosis not present

## 2022-10-18 DIAGNOSIS — M545 Low back pain, unspecified: Secondary | ICD-10-CM | POA: Diagnosis not present

## 2022-10-18 DIAGNOSIS — Z724 Inappropriate diet and eating habits: Secondary | ICD-10-CM | POA: Diagnosis not present

## 2022-10-18 DIAGNOSIS — M9903 Segmental and somatic dysfunction of lumbar region: Secondary | ICD-10-CM | POA: Diagnosis not present

## 2022-10-18 DIAGNOSIS — F419 Anxiety disorder, unspecified: Secondary | ICD-10-CM | POA: Diagnosis not present

## 2022-10-18 DIAGNOSIS — T43655A Adverse effect of methamphetamines, initial encounter: Secondary | ICD-10-CM | POA: Diagnosis not present

## 2022-10-18 DIAGNOSIS — F3132 Bipolar disorder, current episode depressed, moderate: Secondary | ICD-10-CM | POA: Diagnosis not present

## 2022-10-18 DIAGNOSIS — M9904 Segmental and somatic dysfunction of sacral region: Secondary | ICD-10-CM | POA: Diagnosis not present

## 2022-10-18 DIAGNOSIS — M9905 Segmental and somatic dysfunction of pelvic region: Secondary | ICD-10-CM | POA: Diagnosis not present

## 2022-10-18 DIAGNOSIS — Z9151 Personal history of suicidal behavior: Secondary | ICD-10-CM | POA: Diagnosis not present

## 2022-10-20 DIAGNOSIS — M545 Low back pain, unspecified: Secondary | ICD-10-CM | POA: Diagnosis not present

## 2022-10-20 DIAGNOSIS — M9904 Segmental and somatic dysfunction of sacral region: Secondary | ICD-10-CM | POA: Diagnosis not present

## 2022-10-20 DIAGNOSIS — M9903 Segmental and somatic dysfunction of lumbar region: Secondary | ICD-10-CM | POA: Diagnosis not present

## 2022-10-20 DIAGNOSIS — M9905 Segmental and somatic dysfunction of pelvic region: Secondary | ICD-10-CM | POA: Diagnosis not present

## 2022-10-25 DIAGNOSIS — M545 Low back pain, unspecified: Secondary | ICD-10-CM | POA: Diagnosis not present

## 2022-10-25 DIAGNOSIS — M9904 Segmental and somatic dysfunction of sacral region: Secondary | ICD-10-CM | POA: Diagnosis not present

## 2022-10-25 DIAGNOSIS — M9905 Segmental and somatic dysfunction of pelvic region: Secondary | ICD-10-CM | POA: Diagnosis not present

## 2022-10-25 DIAGNOSIS — M9903 Segmental and somatic dysfunction of lumbar region: Secondary | ICD-10-CM | POA: Diagnosis not present

## 2022-10-27 DIAGNOSIS — M9903 Segmental and somatic dysfunction of lumbar region: Secondary | ICD-10-CM | POA: Diagnosis not present

## 2022-10-27 DIAGNOSIS — M9905 Segmental and somatic dysfunction of pelvic region: Secondary | ICD-10-CM | POA: Diagnosis not present

## 2022-10-27 DIAGNOSIS — M545 Low back pain, unspecified: Secondary | ICD-10-CM | POA: Diagnosis not present

## 2022-10-27 DIAGNOSIS — M9904 Segmental and somatic dysfunction of sacral region: Secondary | ICD-10-CM | POA: Diagnosis not present

## 2022-11-01 DIAGNOSIS — M9903 Segmental and somatic dysfunction of lumbar region: Secondary | ICD-10-CM | POA: Diagnosis not present

## 2022-11-01 DIAGNOSIS — M9905 Segmental and somatic dysfunction of pelvic region: Secondary | ICD-10-CM | POA: Diagnosis not present

## 2022-11-01 DIAGNOSIS — M9904 Segmental and somatic dysfunction of sacral region: Secondary | ICD-10-CM | POA: Diagnosis not present

## 2022-11-01 DIAGNOSIS — M545 Low back pain, unspecified: Secondary | ICD-10-CM | POA: Diagnosis not present

## 2022-11-03 ENCOUNTER — Ambulatory Visit: Payer: 59 | Admitting: Behavioral Health

## 2022-11-04 DIAGNOSIS — Z6834 Body mass index (BMI) 34.0-34.9, adult: Secondary | ICD-10-CM | POA: Diagnosis not present

## 2022-11-04 DIAGNOSIS — M48062 Spinal stenosis, lumbar region with neurogenic claudication: Secondary | ICD-10-CM | POA: Diagnosis not present

## 2022-11-08 DIAGNOSIS — M9903 Segmental and somatic dysfunction of lumbar region: Secondary | ICD-10-CM | POA: Diagnosis not present

## 2022-11-08 DIAGNOSIS — M9905 Segmental and somatic dysfunction of pelvic region: Secondary | ICD-10-CM | POA: Diagnosis not present

## 2022-11-08 DIAGNOSIS — M9904 Segmental and somatic dysfunction of sacral region: Secondary | ICD-10-CM | POA: Diagnosis not present

## 2022-11-08 DIAGNOSIS — M545 Low back pain, unspecified: Secondary | ICD-10-CM | POA: Diagnosis not present

## 2022-11-15 DIAGNOSIS — M9903 Segmental and somatic dysfunction of lumbar region: Secondary | ICD-10-CM | POA: Diagnosis not present

## 2022-11-15 DIAGNOSIS — M9904 Segmental and somatic dysfunction of sacral region: Secondary | ICD-10-CM | POA: Diagnosis not present

## 2022-11-15 DIAGNOSIS — M545 Low back pain, unspecified: Secondary | ICD-10-CM | POA: Diagnosis not present

## 2022-11-15 DIAGNOSIS — M9905 Segmental and somatic dysfunction of pelvic region: Secondary | ICD-10-CM | POA: Diagnosis not present

## 2022-11-18 DIAGNOSIS — M48062 Spinal stenosis, lumbar region with neurogenic claudication: Secondary | ICD-10-CM | POA: Diagnosis not present

## 2022-11-22 DIAGNOSIS — M9904 Segmental and somatic dysfunction of sacral region: Secondary | ICD-10-CM | POA: Diagnosis not present

## 2022-11-22 DIAGNOSIS — M545 Low back pain, unspecified: Secondary | ICD-10-CM | POA: Diagnosis not present

## 2022-11-22 DIAGNOSIS — M9905 Segmental and somatic dysfunction of pelvic region: Secondary | ICD-10-CM | POA: Diagnosis not present

## 2022-11-22 DIAGNOSIS — M9903 Segmental and somatic dysfunction of lumbar region: Secondary | ICD-10-CM | POA: Diagnosis not present

## 2022-11-25 ENCOUNTER — Encounter: Payer: Self-pay | Admitting: Physician Assistant

## 2022-12-11 DIAGNOSIS — S93601A Unspecified sprain of right foot, initial encounter: Secondary | ICD-10-CM | POA: Diagnosis not present

## 2022-12-11 DIAGNOSIS — M898X7 Other specified disorders of bone, ankle and foot: Secondary | ICD-10-CM | POA: Diagnosis not present

## 2022-12-11 DIAGNOSIS — M79671 Pain in right foot: Secondary | ICD-10-CM | POA: Diagnosis not present

## 2022-12-13 DIAGNOSIS — Z1331 Encounter for screening for depression: Secondary | ICD-10-CM | POA: Diagnosis not present

## 2022-12-13 DIAGNOSIS — F3132 Bipolar disorder, current episode depressed, moderate: Secondary | ICD-10-CM | POA: Diagnosis not present

## 2022-12-13 DIAGNOSIS — F411 Generalized anxiety disorder: Secondary | ICD-10-CM | POA: Diagnosis not present

## 2022-12-13 DIAGNOSIS — F1491 Cocaine use, unspecified, in remission: Secondary | ICD-10-CM | POA: Diagnosis not present

## 2022-12-13 DIAGNOSIS — F1521 Other stimulant dependence, in remission: Secondary | ICD-10-CM | POA: Diagnosis not present

## 2022-12-14 ENCOUNTER — Ambulatory Visit: Payer: 59 | Admitting: Physician Assistant

## 2022-12-15 ENCOUNTER — Telehealth: Payer: Self-pay | Admitting: Physician Assistant

## 2022-12-15 NOTE — Telephone Encounter (Signed)
Pt would like to transfer to dr hernandez ?

## 2022-12-19 DIAGNOSIS — Z6833 Body mass index (BMI) 33.0-33.9, adult: Secondary | ICD-10-CM | POA: Diagnosis not present

## 2022-12-19 DIAGNOSIS — M48062 Spinal stenosis, lumbar region with neurogenic claudication: Secondary | ICD-10-CM | POA: Diagnosis not present

## 2022-12-19 NOTE — Telephone Encounter (Signed)
Pt scheduled for Wednesday, 12/21/22.

## 2022-12-19 NOTE — Telephone Encounter (Signed)
Lmom for pt to call back. 

## 2022-12-21 ENCOUNTER — Ambulatory Visit (INDEPENDENT_AMBULATORY_CARE_PROVIDER_SITE_OTHER): Payer: 59 | Admitting: Internal Medicine

## 2022-12-21 ENCOUNTER — Encounter: Payer: Self-pay | Admitting: Internal Medicine

## 2022-12-21 VITALS — BP 130/88 | HR 91 | Temp 97.7°F | Ht 62.0 in | Wt 183.5 lb

## 2022-12-21 DIAGNOSIS — N1831 Chronic kidney disease, stage 3a: Secondary | ICD-10-CM

## 2022-12-21 DIAGNOSIS — R030 Elevated blood-pressure reading, without diagnosis of hypertension: Secondary | ICD-10-CM | POA: Diagnosis not present

## 2022-12-21 DIAGNOSIS — E6609 Other obesity due to excess calories: Secondary | ICD-10-CM

## 2022-12-21 DIAGNOSIS — F319 Bipolar disorder, unspecified: Secondary | ICD-10-CM | POA: Diagnosis not present

## 2022-12-21 DIAGNOSIS — Z6833 Body mass index (BMI) 33.0-33.9, adult: Secondary | ICD-10-CM

## 2022-12-21 LAB — COMPREHENSIVE METABOLIC PANEL
ALT: 21 U/L (ref 0–35)
AST: 21 U/L (ref 0–37)
Albumin: 4.2 g/dL (ref 3.5–5.2)
Alkaline Phosphatase: 127 U/L — ABNORMAL HIGH (ref 39–117)
BUN: 15 mg/dL (ref 6–23)
CO2: 25 mEq/L (ref 19–32)
Calcium: 9.6 mg/dL (ref 8.4–10.5)
Chloride: 100 mEq/L (ref 96–112)
Creatinine, Ser: 0.9 mg/dL (ref 0.40–1.20)
GFR: 72.4 mL/min (ref >60.00)
Glucose, Bld: 98 mg/dL (ref 70–99)
Potassium: 4.5 mEq/L (ref 3.5–5.1)
Sodium: 133 mEq/L — ABNORMAL LOW (ref 135–145)
Total Bilirubin: 0.4 mg/dL (ref 0.2–1.2)
Total Protein: 7.1 g/dL (ref 6.0–8.3)

## 2022-12-21 NOTE — Assessment & Plan Note (Signed)
Mood is stable. She is followed by a mental health team.

## 2022-12-21 NOTE — Assessment & Plan Note (Signed)
Recheck kidney function today. Lifestyle changes are key to prevent progression.

## 2022-12-21 NOTE — Progress Notes (Signed)
Established Patient Office Visit     CC/Reason for Visit: Establish care, follow-up chronic conditions  HPI: Maria Pineda is a 57 y.o. female who is coming in today for the above mentioned reasons. Past Medical History is significant for: Bipolar disorder followed by mental health, recently diagnosed stage IIIa chronic kidney disease, obesity.  She has also been noted to have elevated blood pressure on multiple recent doctor appointments.  Never been diagnosed with hypertension.  She does not work, she is originally from [facility], she quit smoking 5 years ago, no alcohol use, no drug allergies, past surgical history significant for cervical fusion, breast augmentation and C-section.  Family history significant for father who had prostate cancer, mother who had lung cancer in both grandfathers with coronary artery disease.   Past Medical/Surgical History: Past Medical History:  Diagnosis Date   Abnormal Pap smear of cervix    Anxiety Ongoing   Bipolar 1 disorder (HCC)    Depression Ongoing   Substance abuse (HCC) 2005   In NA -clean 6 month   Suicide attempt [facility]) 2020   Well woman exam with routine gynecological exam 06/27/2022    Past Surgical History:  Procedure Laterality Date   BREAST SURGERY  Augmentation   CERVICAL BIOPSY  W/ LOOP ELECTRODE EXCISION     CESAREAN SECTION  4/02   COSMETIC SURGERY  6/96, 6/16   Breast augment   CRYOTHERAPY     abnormal pap smear   EYE SURGERY  08/1998   Lasik - eyes reverted after a few years/wear hard contact lenses   SPINE SURGERY  10/12   Fuse C4/5    Social History:  reports that she quit smoking about 5 years ago. Her smoking use included cigarettes and e-cigarettes. She has a 3.75 pack-year smoking history. She does not have any smokeless tobacco history on file. She reports that she does not currently use alcohol. She reports that she does not currently use drugs after having used the following drugs: Amphetamines,  Benzodiazepines, Cocaine, Codeine, Hydrocodone, Marijuana, and Methamphetamines.  Allergies: No Known Allergies  Family History:  Family History  Problem Relation Age of Onset   Arthritis Mother    Cancer Mother    Lung cancer Mother    Anxiety disorder Father    Cancer Father    Depression Father    Prostate cancer Father    ADD / ADHD Brother    Drug abuse Brother    Heart attack Maternal Grandfather    Heart attack Paternal Grandfather      Current Outpatient Medications:    doxepin (SINEQUAN) 10 MG capsule, Take 10 mg by mouth at bedtime., Disp: , Rfl:    hydrOXYzine (ATARAX) 50 MG tablet, Take 50 mg by mouth 2 (two) times daily as needed for anxiety., Disp: , Rfl:    lamoTRIgine (LAMICTAL) 25 MG tablet, Take 150 mg by mouth., Disp: , Rfl:    Lurasidone HCl 120 MG TABS, Take 1 tablet by mouth daily., Disp: , Rfl:    PROZAC 40 MG capsule, Take 40 mg by mouth daily., Disp: , Rfl:   Review of Systems:  Negative unless indicated in HPI.   Physical Exam: Vitals:   12/21/22 1124  BP: 130/88  Pulse: 91  Temp: 97.7 F (36.5 C)  TempSrc: Oral  SpO2: 98%  Weight: 183 lb 8 oz (83.2 kg)  Height: 5\' 2"  (1.575 m)    Body mass index is 33.56 kg/m.   Physical Exam Vitals  reviewed.  Constitutional:      Appearance: Normal appearance.  HENT:     Head: Normocephalic and atraumatic.  Eyes:     Conjunctiva/sclera: Conjunctivae normal.     Pupils: Pupils are equal, round, and reactive to light.  Cardiovascular:     Rate and Rhythm: Normal rate and regular rhythm.  Pulmonary:     Effort: Pulmonary effort is normal.     Breath sounds: Normal breath sounds.  Skin:    General: Skin is warm and dry.  Neurological:     General: No focal deficit present.     Mental Status: She is alert and oriented to person, place, and time.  Psychiatric:        Mood and Affect: Mood normal.        Behavior: Behavior normal.        Thought Content: Thought content normal.         Judgment: Judgment normal.      Impression and Plan:  Class 1 obesity due to excess calories without serious comorbidity with body mass index (BMI) of 33.0 to 33.9 in adult Assessment & Plan: -Discussed healthy lifestyle, including increased physical activity and better food choices to promote weight loss. -She is interested in pursuing a GLP1. She will make sure her insurance will cover. We have discussed pros and cons. -She is already in the weight and wellness program and advised to continue.   Chronic kidney disease, stage 3a (HCC) Assessment & Plan: Recheck kidney function today. Lifestyle changes are key to prevent progression.  Orders: -     Comprehensive metabolic panel; Future  Bipolar 1 disorder [facility]) Assessment & Plan: Mood is stable. She is followed by a mental health team.   Elevated BP without diagnosis of hypertension  -Advised to do ambulatory blood pressure monitoring and return in 6 to 8 weeks for follow-up.   Time spent today: 32 minutes reviewing chart, interviewing and examining patient and formulating plan of care.  We spent a lot of time discussing nutrition for weight loss purposes.     Chaya Jan, MD [facility]

## 2022-12-21 NOTE — Assessment & Plan Note (Signed)
-  Discussed healthy lifestyle, including increased physical activity and better food choices to promote weight loss. -She is interested in pursuing a GLP1. She will make sure her insurance will cover. We have discussed pros and cons. -She is already in the weight and wellness program and advised to continue.

## 2023-01-10 ENCOUNTER — Ambulatory Visit (INDEPENDENT_AMBULATORY_CARE_PROVIDER_SITE_OTHER): Payer: 59 | Admitting: Obstetrics & Gynecology

## 2023-01-10 ENCOUNTER — Other Ambulatory Visit (HOSPITAL_COMMUNITY)
Admission: RE | Admit: 2023-01-10 | Discharge: 2023-01-10 | Disposition: A | Discharge status: Home / Self Care | Payer: 59 | Source: Ambulatory Visit | Attending: Obstetrics & Gynecology | Admitting: Obstetrics & Gynecology

## 2023-01-10 ENCOUNTER — Encounter: Payer: Self-pay | Admitting: Obstetrics & Gynecology

## 2023-01-10 VITALS — BP 124/84 | HR 105

## 2023-01-10 DIAGNOSIS — R87611 Atypical squamous cells cannot exclude high grade squamous intraepithelial lesion on cytologic smear of cervix (ASC-H): Secondary | ICD-10-CM | POA: Diagnosis not present

## 2023-01-10 DIAGNOSIS — N870 Mild cervical dysplasia: Secondary | ICD-10-CM | POA: Diagnosis not present

## 2023-01-10 NOTE — Progress Notes (Signed)
    Maria Pineda 07/12/1966 [id number]        57 y.o.  [id number]   RP: Repeat Pap at 6 months  HPI: ASCUS, cannot r/o HGSIL, HPV HR Neg on 06/27/22.  Colposcopy on 07/06/22 showed Mild Cervical Dysplasia (CIN 1) both on a Cervical Bx and ECC. Abstinent x >2 years. H/O LEEP/Cryo x 2/Laser of Cervix in the 1990's per patient. Endometrial Ablation in 2015. Postmenopause, no HRT. No PMB. No pelvic pain.    OB History  Gravida Para Term Preterm AB Living  4 2 2   2 2   SAB IAB Ectopic Multiple Live Births  2            # Outcome Date GA Lbr Len/2nd Weight Sex Delivery Anes PTL Lv  4 SAB           3 SAB           2 Term           1 Term             Past medical history,surgical history, problem list, medications, allergies, family history and social history were all reviewed and documented in the EPIC chart.   Directed ROS with pertinent positives and negatives documented in the history of present illness/assessment and plan.  Exam:  Vitals:   01/10/23 1433  BP: 124/84  Pulse: (!) 105  SpO2: 99%   General appearance:  Normal  Gynecologic exam: Vulva normal.  Speculum:  Cervix wnl (s/p LEEP x 2 and Laser).  Pap/HPV HR done.   Assessment/Plan:  57 y.o. [id number]   1. Mild cervical dysplasia, histologically confirmed ASCUS, cannot r/o HGSIL, HPV HR Neg on 06/27/22.  Colposcopy on 07/06/22 showed Mild Cervical Dysplasia (CIN 1) both on a Cervical Bx and ECC. Abstinent x >2 years. H/O LEEP/Cryo x 2/Laser of Cervix in the 1990's per patient. Endometrial Ablation in 2015. Postmenopause, no HRT. No PMB. No pelvic pain. Pap/HPV HR done today.  Management per results. - Cytology - PAP( [place])  2. Pap smear of cervix with ASCUS, cannot exclude HGSIL - Cytology - PAP( [place])  Other orders - hydrOXYzine (ATARAX) 25 MG tablet; Take 25 mg by mouth 2 (two) times daily as needed. - lamoTRIgine (LAMICTAL) 150 MG tablet; Take 150 mg by mouth daily.   Genia Del MD, 2:44 PM  01/10/2023

## 2023-01-16 LAB — CYTOLOGY - PAP
Comment: NEGATIVE
Diagnosis: REACTIVE
High risk HPV: NEGATIVE

## 2023-01-18 DIAGNOSIS — F319 Bipolar disorder, unspecified: Secondary | ICD-10-CM | POA: Diagnosis not present

## 2023-01-18 DIAGNOSIS — M4800 Spinal stenosis, site unspecified: Secondary | ICD-10-CM | POA: Diagnosis not present

## 2023-01-18 DIAGNOSIS — F411 Generalized anxiety disorder: Secondary | ICD-10-CM | POA: Diagnosis not present

## 2023-01-18 DIAGNOSIS — Z6832 Body mass index (BMI) 32.0-32.9, adult: Secondary | ICD-10-CM | POA: Diagnosis not present

## 2023-01-18 DIAGNOSIS — Z87891 Personal history of nicotine dependence: Secondary | ICD-10-CM | POA: Diagnosis not present

## 2023-01-18 DIAGNOSIS — Z809 Family history of malignant neoplasm, unspecified: Secondary | ICD-10-CM | POA: Diagnosis not present

## 2023-01-18 DIAGNOSIS — I129 Hypertensive chronic kidney disease with stage 1 through stage 4 chronic kidney disease, or unspecified chronic kidney disease: Secondary | ICD-10-CM | POA: Diagnosis not present

## 2023-01-18 DIAGNOSIS — E669 Obesity, unspecified: Secondary | ICD-10-CM | POA: Diagnosis not present

## 2023-01-18 DIAGNOSIS — N189 Chronic kidney disease, unspecified: Secondary | ICD-10-CM | POA: Diagnosis not present

## 2023-01-29 ENCOUNTER — Encounter: Payer: Self-pay | Admitting: Internal Medicine

## 2023-02-06 ENCOUNTER — Other Ambulatory Visit: Payer: Self-pay | Admitting: Internal Medicine

## 2023-02-06 DIAGNOSIS — E6609 Other obesity due to excess calories: Secondary | ICD-10-CM

## 2023-02-06 MED ORDER — ZEPBOUND 2.5 MG/0.5ML ~~LOC~~ SOAJ: 2.5000 mg | SUBCUTANEOUS | 0 refills | Status: AC

## 2023-02-07 ENCOUNTER — Ambulatory Visit: Payer: 59 | Admitting: Physician Assistant

## 2023-02-15 ENCOUNTER — Ambulatory Visit: Payer: 59 | Admitting: Internal Medicine

## 2023-02-28 ENCOUNTER — Ambulatory Visit (INDEPENDENT_AMBULATORY_CARE_PROVIDER_SITE_OTHER): Payer: 59 | Admitting: Internal Medicine

## 2023-02-28 VITALS — BP 120/82 | HR 90 | Temp 97.8°F | Wt 181.1 lb

## 2023-02-28 DIAGNOSIS — R030 Elevated blood-pressure reading, without diagnosis of hypertension: Secondary | ICD-10-CM | POA: Diagnosis not present

## 2023-02-28 NOTE — Progress Notes (Signed)
Established Patient Office Visit     CC/Reason for Visit: Follow-up blood pressure  HPI: Maria Pineda is a 57 y.o. female who is coming in today for the above mentioned reasons. Past Medical History is significant for: Bipolar disorder, stage III chronic kidney disease, obesity.  She is here today to follow-up on blood pressure.  It has been noted to be elevated on multiple occasions before.  She states ambulatory measurements are usually with systolics of around 110-120 and diastolics 70s to 80s.  She has no acute concerns or complaints.   Past Medical/Surgical History: Past Medical History:  Diagnosis Date   Abnormal Pap smear of cervix    Anxiety Ongoing   Bipolar 1 disorder (HCC)    Depression Ongoing   Substance abuse (HCC) 2005   In NA -clean 6 month   Suicide attempt [facility]) 2020   Well woman exam with routine gynecological exam 06/27/2022    Past Surgical History:  Procedure Laterality Date   BREAST SURGERY  Augmentation   CERVICAL BIOPSY  W/ LOOP ELECTRODE EXCISION     CESAREAN SECTION  4/02   COSMETIC SURGERY  6/96, 6/16   Breast augment   CRYOTHERAPY     abnormal pap smear   EYE SURGERY  08/1998   Lasik - eyes reverted after a few years/wear hard contact lenses   SPINE SURGERY  10/12   Fuse C4/5    Social History:  reports that she quit smoking about 5 years ago. Her smoking use included cigarettes and e-cigarettes. She started smoking about 20 years ago. She has a 3.8 pack-year smoking history. She does not have any smokeless tobacco history on file. She reports that she does not currently use alcohol. She reports that she does not currently use drugs after having used the following drugs: Amphetamines, Benzodiazepines, Cocaine, Codeine, Hydrocodone, Marijuana, and Methamphetamines.  Allergies: No Known Allergies  Family History:  Family History  Problem Relation Age of Onset   Arthritis Mother    Cancer Mother    Lung cancer Mother    Anxiety  disorder Father    Cancer Father    Depression Father    Prostate cancer Father    ADD / ADHD Brother    Drug abuse Brother    Heart attack Maternal Grandfather    Heart attack Paternal Grandfather      Current Outpatient Medications:    doxepin (SINEQUAN) 10 MG capsule, Take 10 mg by mouth at bedtime., Disp: , Rfl:    hydrOXYzine (ATARAX) 25 MG tablet, Take 25 mg by mouth 2 (two) times daily as needed., Disp: , Rfl:    lamoTRIgine (LAMICTAL) 150 MG tablet, Take 150 mg by mouth daily., Disp: , Rfl:    Lurasidone HCl 120 MG TABS, Take 1 tablet by mouth daily., Disp: , Rfl:    PROZAC 40 MG capsule, Take 40 mg by mouth daily., Disp: , Rfl:    tirzepatide (ZEPBOUND) 2.5 MG/0.5ML Pen, Inject 2.5 mg into the skin once a week., Disp: 2 mL, Rfl: 0  Review of Systems:  Negative unless indicated in HPI.   Physical Exam: Vitals:   02/28/23 1027  BP: 120/82  Pulse: 90  Temp: 97.8 F (36.6 C)  TempSrc: Oral  SpO2: 98%  Weight: 181 lb 1.6 oz (82.1 kg)    Body mass index is 33.12 kg/m.   Physical Exam Vitals reviewed.  Constitutional:      Appearance: Normal appearance.  HENT:     Head:  Normocephalic and atraumatic.  Eyes:     Conjunctiva/sclera: Conjunctivae normal.     Pupils: Pupils are equal, round, and reactive to light.  Cardiovascular:     Rate and Rhythm: Normal rate and regular rhythm.  Pulmonary:     Effort: Pulmonary effort is normal.     Breath sounds: Normal breath sounds.  Skin:    General: Skin is warm and dry.  Neurological:     General: No focal deficit present.     Mental Status: She is alert and oriented to person, place, and time.  Psychiatric:        Mood and Affect: Mood normal.        Behavior: Behavior normal.        Thought Content: Thought content normal.        Judgment: Judgment normal.      Impression and Plan:  Elevated BP without diagnosis of hypertension  -Blood pressure is well-controlled in office today, this coincides with home  measurements.  No changes.   Time spent:22 minutes reviewing chart, interviewing and examining patient and formulating plan of care.     Chaya Jan, MD [facility] at [facility]

## 2023-03-02 ENCOUNTER — Encounter (INDEPENDENT_AMBULATORY_CARE_PROVIDER_SITE_OTHER): Payer: Self-pay

## 2023-03-14 DIAGNOSIS — F1521 Other stimulant dependence, in remission: Secondary | ICD-10-CM | POA: Diagnosis not present

## 2023-03-14 DIAGNOSIS — F3132 Bipolar disorder, current episode depressed, moderate: Secondary | ICD-10-CM | POA: Diagnosis not present

## 2023-03-14 DIAGNOSIS — F411 Generalized anxiety disorder: Secondary | ICD-10-CM | POA: Diagnosis not present

## 2023-03-14 DIAGNOSIS — F1491 Cocaine use, unspecified, in remission: Secondary | ICD-10-CM | POA: Diagnosis not present

## 2023-04-24 IMAGING — CR DG LUMBAR SPINE COMPLETE 4+V
5 series · 5 of 5 positions shown · non-contrast
Comparison: No prior.

CLINICAL DATA: Lumbosacral pain.  No recent injury.

EXAM:
LUMBAR SPINE - COMPLETE 4+ VIEW

[t l-spine a.p.]
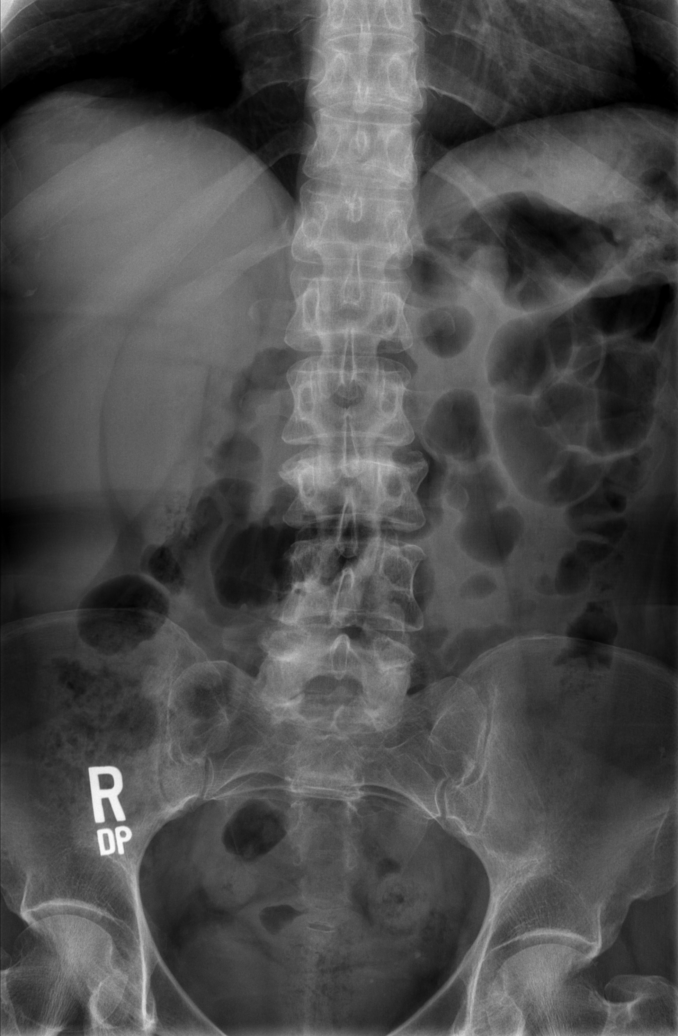

[t l-spine oblique exposure (1 of 2)]
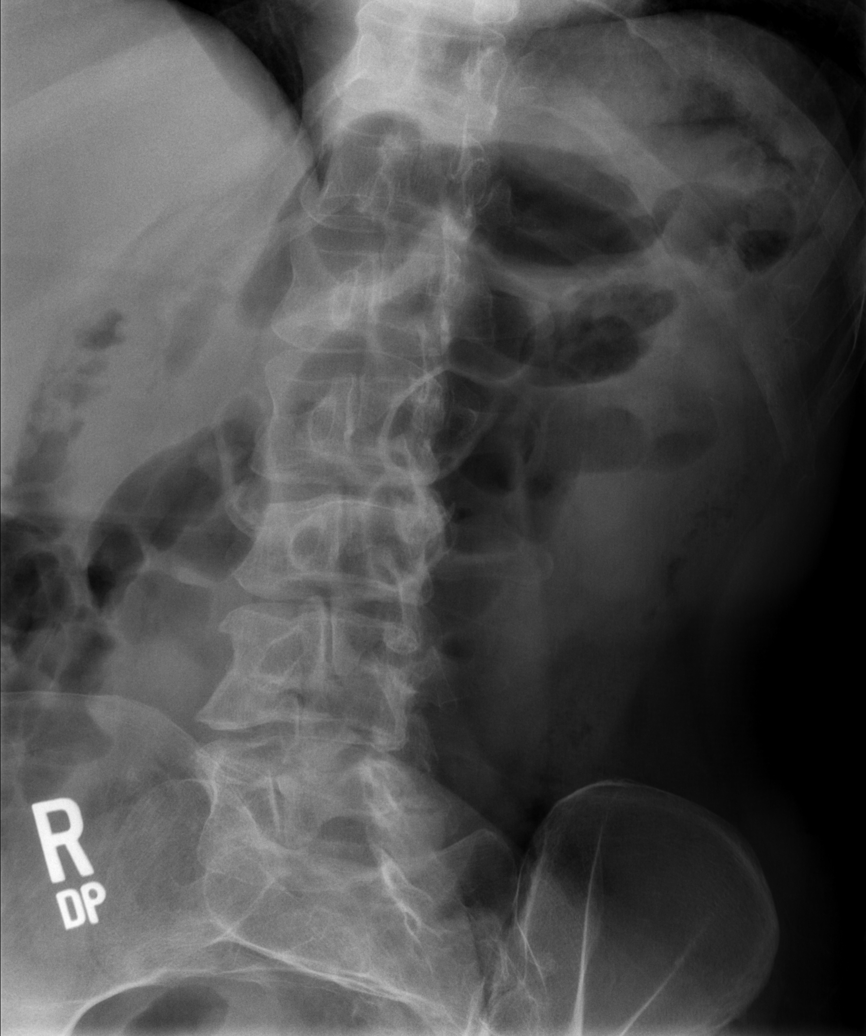

[t l-spine oblique exposure (2 of 2)]
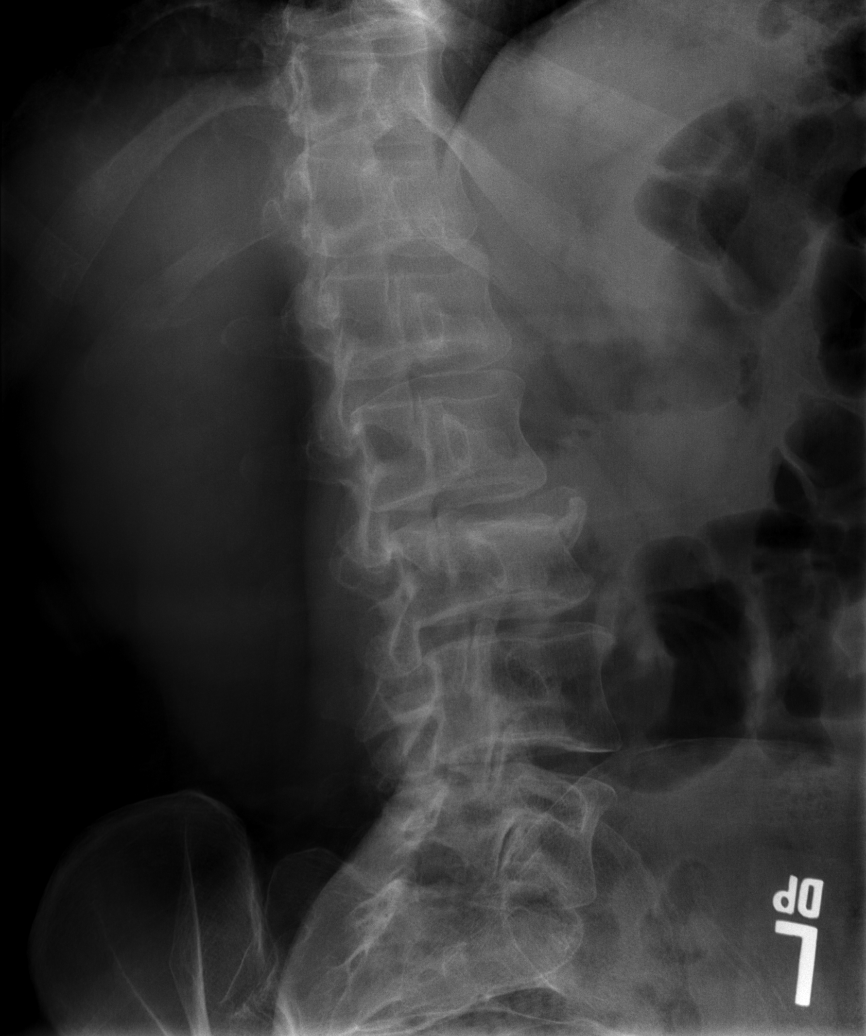

[t l-spine lat]
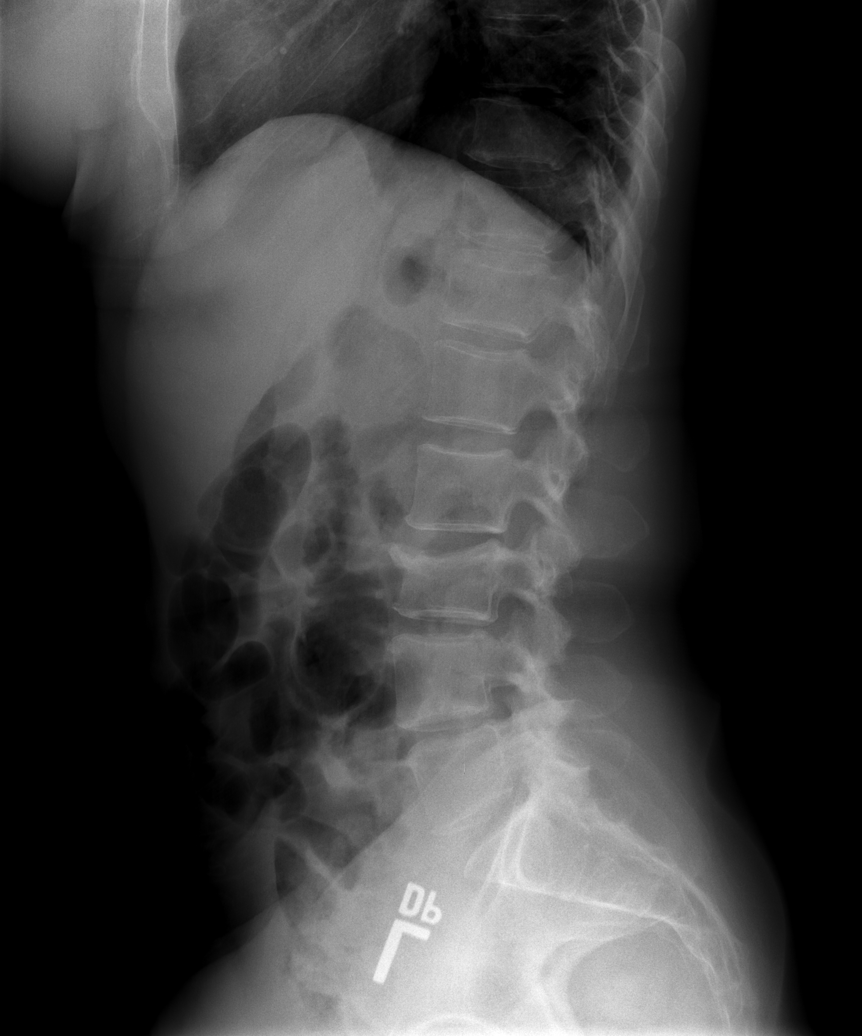

[t l-spine l5-s1 spot]
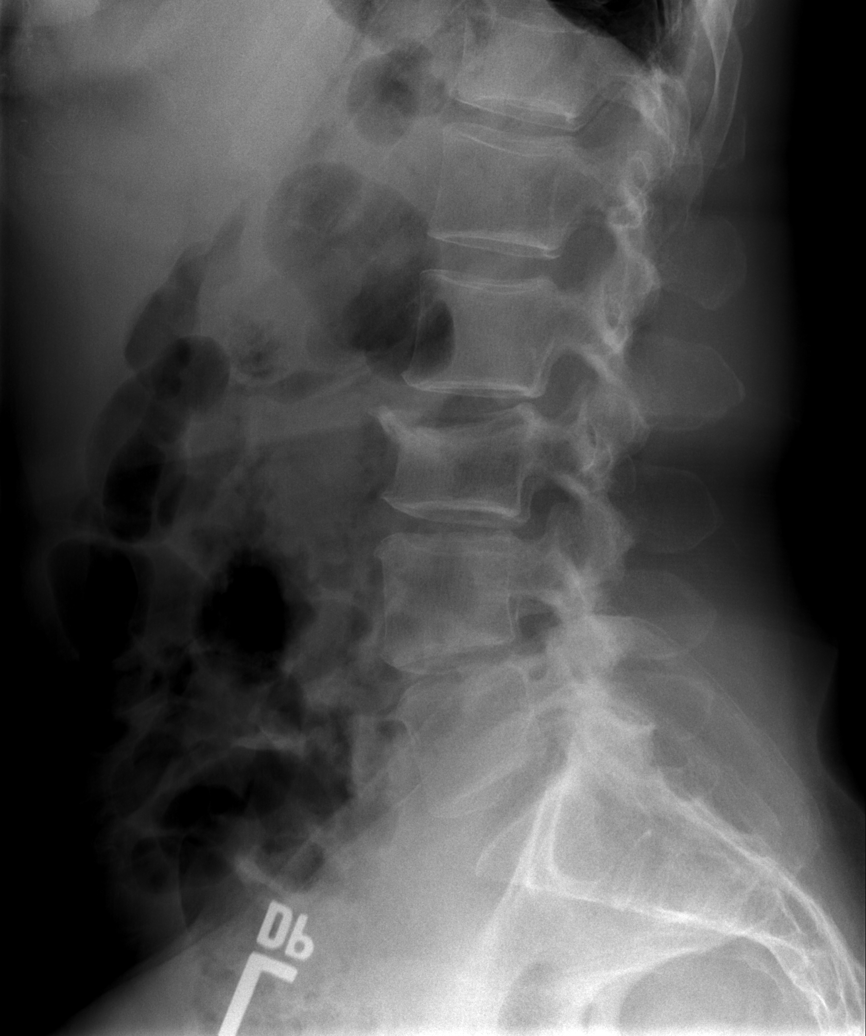

[5 of 5 positions shown; findings below may reference images not displayed]

FINDINGS: Lumbar spine numbered the lowest segmented appearing lumbar shaped
vertebrae on lateral view as L5. Mild scoliosis concave right. Mild
degenerative changes lower lumbar spine. Mild compression fracture
of L3. This may be old. Pelvic calcifications consistent
phleboliths.
IMPRESSION: 1. Mild scoliosis concave right. Mild degenerative changes lower
lumbar spine.

2. Mild compression fracture of L3. This may be old. No other focal
abnormalities identified.

## 2023-05-17 DIAGNOSIS — M48062 Spinal stenosis, lumbar region with neurogenic claudication: Secondary | ICD-10-CM | POA: Diagnosis not present

## 2023-05-17 DIAGNOSIS — M4727 Other spondylosis with radiculopathy, lumbosacral region: Secondary | ICD-10-CM | POA: Diagnosis not present

## 2023-05-22 ENCOUNTER — Ambulatory Visit: Payer: 59 | Admitting: Internal Medicine

## 2023-05-30 DIAGNOSIS — M48062 Spinal stenosis, lumbar region with neurogenic claudication: Secondary | ICD-10-CM | POA: Diagnosis not present

## 2023-06-13 DIAGNOSIS — F411 Generalized anxiety disorder: Secondary | ICD-10-CM | POA: Diagnosis not present

## 2023-06-13 DIAGNOSIS — F1521 Other stimulant dependence, in remission: Secondary | ICD-10-CM | POA: Diagnosis not present

## 2023-06-13 DIAGNOSIS — F1491 Cocaine use, unspecified, in remission: Secondary | ICD-10-CM | POA: Diagnosis not present

## 2023-06-13 DIAGNOSIS — F3132 Bipolar disorder, current episode depressed, moderate: Secondary | ICD-10-CM | POA: Diagnosis not present

## 2023-06-26 NOTE — Progress Notes (Unsigned)
57 y.o. [id number] Divorced Caucasian female here for annual exam.    Lost 40 pounds intentionally.   Not sexually active. Declines STD screening.   Hx LEEP, cryotherapy, and laser to her cervix.   Wants a flu vaccine.   Moved from [facility] [place] to establish a life in [place].  Enjoying work at a [employer] at [employer].   PCP: Philip Aspen, Limmie Patricia, MD  Dr. Zachery Conch - Psychiatry.   No LMP recorded. Patient is postmenopausal.           Sexually active: No.  The current method of family planning is post menopausal status.    Menopausal hormone therapy:  n/a Exercising: Yes.     Walking 20-30 min three days a week Smoker:  former  OB History  Gravida Para Term Preterm AB Living  4 2 2   2 2   SAB IAB Ectopic Multiple Live Births  2            # Outcome Date GA Lbr Len/2nd Weight Sex Type Anes PTL Lv  4 SAB           3 SAB           2 Term           1 Term              HEALTH MAINTENANCE: Last 2 paps:  01/10/23 neg: HR HPV neg, 06/27/22 ASC-H: HR HPV neg.  Colposcopy:  LGSIL.  History of abnormal Pap or positive HPV:  yes Mammogram:   08/25/22 Breast Density Cat B, BI-RADS CAT 1 neg Colonoscopy:  cologuard 2022 per pt Bone Density:  n/a  Result  n/a   Immunization History  Administered Date(s) Administered   Influenza Inj Mdck Quad Pf 05/16/2017   Influenza,inj,Quad PF,6+ Mos 04/28/2015, 04/20/2016, 05/14/2018, 08/22/2019, 06/27/2022   Influenza-Unspecified 05/01/2013   PFIZER(Purple Top)SARS-COV-2 Vaccination 11/05/2019, 11/27/2019   Pneumococcal Polysaccharide-23 04/28/2015      reports that she quit smoking about 5 years ago. Her smoking use included cigarettes and e-cigarettes. She started smoking about 20 years ago. She has a 3.8 pack-year smoking history. She does not have any smokeless tobacco history on file. She reports that she does not currently use alcohol. She reports that she does not currently use drugs after having used the  following drugs: Amphetamines, Benzodiazepines, Cocaine, Codeine, Hydrocodone, Marijuana, and Methamphetamines.  Past Medical History:  Diagnosis Date   Abnormal Pap smear of cervix    Anxiety Ongoing   Bipolar 1 disorder (HCC)    Depression Ongoing   Substance abuse (HCC) 2005   In NA -clean 6 month   Suicide attempt [facility]) 2020   Well woman exam with routine gynecological exam 06/27/2022    Past Surgical History:  Procedure Laterality Date   BREAST SURGERY  Augmentation   CERVICAL BIOPSY  W/ LOOP ELECTRODE EXCISION     CESAREAN SECTION  4/02   COSMETIC SURGERY  6/96, 6/16   Breast augment   CRYOTHERAPY     abnormal pap smear   EYE SURGERY  08/1998   Lasik - eyes reverted after a few years/wear hard contact lenses   SPINE SURGERY  10/12   Fuse C4/5    Current Outpatient Medications  Medication Sig Dispense Refill   lamoTRIgine (LAMICTAL) 150 MG tablet Take 150 mg by mouth daily.     Lurasidone HCl 120 MG TABS Take 1 tablet by mouth daily.     PROZAC 40  MG capsule Take 40 mg by mouth daily.     tirzepatide (ZEPBOUND) 2.5 MG/0.5ML Pen Inject 2.5 mg into the skin once a week. 2 mL 0   No current facility-administered medications for this visit.    ALLERGIES: Patient has no known allergies.  Family History  Problem Relation Age of Onset   Arthritis Mother    Cancer Mother    Lung cancer Mother    Anxiety disorder Father    Cancer Father    Depression Father    Prostate cancer Father    ADD / ADHD Brother    Drug abuse Brother    Heart attack Maternal Grandfather    Heart attack Paternal Grandfather     Review of Systems  All other systems reviewed and are negative.   PHYSICAL EXAM:  BP 122/82 (BP Location: Left Arm, Patient Position: Sitting, Cuff Size: Normal)   Pulse 97   Ht 5\' 3"  (1.6 m)   Wt 155 lb (70.3 kg)   SpO2 100%   BMI 27.46 kg/m     General appearance: alert, cooperative and appears stated age Head: normocephalic, without obvious  abnormality, atraumatic Neck: no adenopathy, supple, symmetrical, trachea midline and thyroid normal to inspection and palpation Lungs: clear to auscultation bilaterally Breasts: consistent with implants, no masses or tenderness, No nipple retraction or dimpling, No nipple discharge or bleeding, No axillary adenopathy Heart: regular rate and rhythm Abdomen: soft, non-tender; no masses, no organomegaly Extremities: extremities normal, atraumatic, no cyanosis or edema Skin: skin color, texture, turgor normal. No rashes or lesions Lymph nodes: cervical, supraclavicular, and axillary nodes normal. Neurologic: grossly normal  Pelvic: External genitalia:  no lesions              No abnormal inguinal nodes palpated.              Urethra:  normal appearing urethra with no masses, tenderness or lesions              Bartholins and Skenes: normal                 Vagina: normal appearing vagina with normal color and discharge, no lesions              Cervix: no lesions              Pap taken: Yes.   Bimanual Exam:  Uterus:  normal size, contour, position, consistency, mobility, non-tender              Adnexa: no mass, fullness, tenderness              Rectal exam: Yes.  .  Confirms.              Anus:  normal sphincter tone, no lesions  Chaperone was present for exam:  Warren Lacy, CMA  ASSESSMENT: Well woman visit with gynecologic exam Status post endometrial ablation.  Hx LEEP, cryotherapy, and laser to cervix.  Hx ASCUS-H pap and LGSIL on colposcopy 07/2022.  Follow up pap normal and neg HR HPV 01/10/23.  Bilateral breast implants. Chronic renal disease.  Hx bipolar depression.   PLAN: Mammogram screening discussed. Self breast awareness reviewed. Pap and HRV collected:  yes Guidelines for Calcium, Vitamin D, regular exercise program including cardiovascular and weight bearing exercise. Medication refills:  NA Labs with PCP.  Flu vaccine.  Follow up:  1 year and prn.

## 2023-07-10 ENCOUNTER — Encounter: Payer: Self-pay | Admitting: Obstetrics and Gynecology

## 2023-07-10 ENCOUNTER — Other Ambulatory Visit (HOSPITAL_COMMUNITY)
Admission: RE | Admit: 2023-07-10 | Discharge: 2023-07-10 | Disposition: A | Discharge status: Home / Self Care | Payer: 59 | Source: Ambulatory Visit | Attending: Obstetrics and Gynecology | Admitting: Obstetrics and Gynecology

## 2023-07-10 ENCOUNTER — Ambulatory Visit (INDEPENDENT_AMBULATORY_CARE_PROVIDER_SITE_OTHER): Payer: 59 | Admitting: Obstetrics and Gynecology

## 2023-07-10 VITALS — BP 122/82 | HR 97 | Ht 63.0 in | Wt 155.0 lb

## 2023-07-10 DIAGNOSIS — Z01419 Encounter for gynecological examination (general) (routine) without abnormal findings: Secondary | ICD-10-CM

## 2023-07-10 DIAGNOSIS — Z23 Encounter for immunization: Secondary | ICD-10-CM | POA: Diagnosis not present

## 2023-07-10 DIAGNOSIS — N879 Dysplasia of cervix uteri, unspecified: Secondary | ICD-10-CM

## 2023-07-10 NOTE — Patient Instructions (Signed)

## 2023-07-12 ENCOUNTER — Other Ambulatory Visit: Payer: Self-pay | Admitting: Internal Medicine

## 2023-07-12 DIAGNOSIS — Z1231 Encounter for screening mammogram for malignant neoplasm of breast: Secondary | ICD-10-CM

## 2023-07-12 LAB — CYTOLOGY - PAP
Comment: NEGATIVE
Diagnosis: NEGATIVE
High risk HPV: NEGATIVE

## 2023-08-17 ENCOUNTER — Ambulatory Visit (INDEPENDENT_AMBULATORY_CARE_PROVIDER_SITE_OTHER): Payer: 59 | Admitting: Internal Medicine

## 2023-08-17 ENCOUNTER — Encounter: Payer: Self-pay | Admitting: Internal Medicine

## 2023-08-17 VITALS — BP 120/88 | HR 105 | Temp 98.3°F | Ht 61.0 in | Wt 143.9 lb

## 2023-08-17 DIAGNOSIS — N1831 Chronic kidney disease, stage 3a: Secondary | ICD-10-CM | POA: Diagnosis not present

## 2023-08-17 DIAGNOSIS — Z0000 Encounter for general adult medical examination without abnormal findings: Secondary | ICD-10-CM | POA: Diagnosis not present

## 2023-08-17 DIAGNOSIS — Z23 Encounter for immunization: Secondary | ICD-10-CM

## 2023-08-17 DIAGNOSIS — F3181 Bipolar II disorder: Secondary | ICD-10-CM

## 2023-08-17 LAB — COMPREHENSIVE METABOLIC PANEL
ALT: 20 U/L (ref 0–35)
AST: 17 U/L (ref 0–37)
Albumin: 4.8 g/dL (ref 3.5–5.2)
Alkaline Phosphatase: 106 U/L (ref 39–117)
BUN: 13 mg/dL (ref 6–23)
CO2: 28 meq/L (ref 19–32)
Calcium: 9.9 mg/dL (ref 8.4–10.5)
Chloride: 95 meq/L — ABNORMAL LOW (ref 96–112)
Creatinine, Ser: 0.97 mg/dL (ref 0.40–1.20)
GFR: 65.88 mL/min (ref >60.00)
Glucose, Bld: 75 mg/dL (ref 70–99)
Potassium: 4.8 meq/L (ref 3.5–5.1)
Sodium: 131 meq/L — ABNORMAL LOW (ref 135–145)
Total Bilirubin: 0.7 mg/dL (ref 0.2–1.2)
Total Protein: 6.9 g/dL (ref 6.0–8.3)

## 2023-08-17 LAB — CBC WITH DIFFERENTIAL/PLATELET
Basophils Absolute: 0 10*3/uL (ref 0.0–0.1)
Basophils Relative: 0.5 % (ref 0.0–3.0)
Eosinophils Absolute: 0.1 10*3/uL (ref 0.0–0.7)
Eosinophils Relative: 0.7 % (ref 0.0–5.0)
HCT: 43.2 % (ref 36.0–46.0)
Hemoglobin: 14.7 g/dL (ref 12.0–15.0)
Lymphocytes Relative: 19.6 % (ref 12.0–46.0)
Lymphs Abs: 1.9 10*3/uL (ref 0.7–4.0)
MCHC: 33.9 g/dL (ref 30.0–36.0)
MCV: 90.2 fL (ref 78.0–100.0)
Monocytes Absolute: 0.6 10*3/uL (ref 0.1–1.0)
Monocytes Relative: 6.6 % (ref 3.0–12.0)
Neutro Abs: 6.9 10*3/uL (ref 1.4–7.7)
Neutrophils Relative %: 72.6 % (ref 43.0–77.0)
Platelets: 440 10*3/uL — ABNORMAL HIGH (ref 150.0–400.0)
RBC: 4.79 Mil/uL (ref 3.87–5.11)
RDW: 14.1 % (ref 11.5–15.5)
WBC: 9.5 10*3/uL (ref 4.0–10.5)

## 2023-08-17 LAB — LIPID PANEL
Cholesterol: 192 mg/dL (ref 0–200)
HDL: 61.4 mg/dL (ref >39.00)
LDL Cholesterol: 121 mg/dL — ABNORMAL HIGH (ref 0–99)
NonHDL: 130.45
Total CHOL/HDL Ratio: 3
Triglycerides: 49 mg/dL (ref 0.0–149.0)
VLDL: 9.8 mg/dL (ref 0.0–40.0)

## 2023-08-17 LAB — TSH: TSH: 0.43 u[IU]/mL (ref 0.35–5.50)

## 2023-08-17 LAB — HEMOGLOBIN A1C: Hgb A1c MFr Bld: 5.3 % (ref 4.6–6.5)

## 2023-08-17 LAB — VITAMIN D 25 HYDROXY (VIT D DEFICIENCY, FRACTURES): VITD: 94.13 ng/mL (ref 30.00–100.00)

## 2023-08-17 LAB — VITAMIN B12: Vitamin B-12: 645 pg/mL (ref 211–911)

## 2023-08-17 NOTE — Progress Notes (Signed)
Established Patient Office Visit     CC/Reason for Visit: Annual preventive exam  HPI: Maria Pineda is a 58 y.o. female who is coming in today for the above mentioned reasons. Past Medical History is significant for: Bipolar disorder, chronic kidney disease stage III and obesity.  She has lost close to 40 pounds on Zepbound.  She has no acute concerns or complaints.   Past Medical/Surgical History: Past Medical History:  Diagnosis Date   Abnormal Pap smear of cervix    Anxiety Ongoing   Bipolar 1 disorder (HCC)    Depression Ongoing   Substance abuse (HCC) 2005   In NA -clean 6 month   Suicide attempt [facility]) 2020   Well woman exam with routine gynecological exam 06/27/2022    Past Surgical History:  Procedure Laterality Date   BREAST SURGERY  Augmentation   CERVICAL BIOPSY  W/ LOOP ELECTRODE EXCISION     CESAREAN SECTION  4/02   COSMETIC SURGERY  6/96, 6/16   Breast augment   CRYOTHERAPY     abnormal pap smear   EYE SURGERY  08/1998   Lasik - eyes reverted after a few years/wear hard contact lenses   SPINE SURGERY  10/12   Fuse C4/5    Social History:  reports that she quit smoking about 6 years ago. Her smoking use included cigarettes and e-cigarettes. She started smoking about 21 years ago. She has a 3.8 pack-year smoking history. She does not have any smokeless tobacco history on file. She reports that she does not currently use alcohol. She reports that she does not currently use drugs after having used the following drugs: Amphetamines, Benzodiazepines, Cocaine, Codeine, Hydrocodone, Marijuana, and Methamphetamines.  Allergies: No Known Allergies  Family History:  Family History  Problem Relation Age of Onset   Arthritis Mother    Cancer Mother    Lung cancer Mother    Anxiety disorder Father    Cancer Father    Depression Father    Prostate cancer Father    ADD / ADHD Brother    Drug abuse Brother    Heart attack Maternal Grandfather    Heart  attack Paternal Grandfather      Current Outpatient Medications:    lamoTRIgine (LAMICTAL) 150 MG tablet, Take 150 mg by mouth daily., Disp: , Rfl:    Lurasidone HCl 120 MG TABS, Take 1 tablet by mouth daily., Disp: , Rfl:    PROZAC 40 MG capsule, Take 40 mg by mouth daily., Disp: , Rfl:    tirzepatide (ZEPBOUND) 2.5 MG/0.5ML Pen, Inject 2.5 mg into the skin once a week., Disp: 2 mL, Rfl: 0  Review of Systems:  Negative unless indicated in HPI.   Physical Exam: Vitals:   08/17/23 1403  BP: 120/88  Pulse: (!) 105  Temp: 98.3 F (36.8 C)  TempSrc: Oral  SpO2: 94%  Weight: 143 lb 14.4 oz (65.3 kg)  Height: 5\' 1"  (1.549 m)    Body mass index is 27.19 kg/m.   Physical Exam Vitals reviewed.  Constitutional:      General: She is not in acute distress.    Appearance: Normal appearance. She is not ill-appearing, toxic-appearing or diaphoretic.  HENT:     Head: Normocephalic.     Right Ear: Tympanic membrane, ear canal and external ear normal. There is no impacted cerumen.     Left Ear: Tympanic membrane, ear canal and external ear normal. There is no impacted cerumen.     Nose:  Nose normal.     Mouth/Throat:     Mouth: Mucous membranes are moist.     Pharynx: Oropharynx is clear. No oropharyngeal exudate or posterior oropharyngeal erythema.  Eyes:     General: No scleral icterus.       Right eye: No discharge.        Left eye: No discharge.     Conjunctiva/sclera: Conjunctivae normal.     Pupils: Pupils are equal, round, and reactive to light.  Neck:     Vascular: No carotid bruit.  Cardiovascular:     Rate and Rhythm: Normal rate and regular rhythm.     Pulses: Normal pulses.     Heart sounds: Normal heart sounds.  Pulmonary:     Effort: Pulmonary effort is normal. No respiratory distress.     Breath sounds: Normal breath sounds.  Abdominal:     General: Abdomen is flat. Bowel sounds are normal.     Palpations: Abdomen is soft.  Musculoskeletal:        General:  Normal range of motion.     Cervical back: Normal range of motion.  Skin:    General: Skin is warm and dry.  Neurological:     General: No focal deficit present.     Mental Status: She is alert and oriented to person, place, and time. Mental status is at baseline.  Psychiatric:        Mood and Affect: Mood normal.        Behavior: Behavior normal.        Thought Content: Thought content normal.        Judgment: Judgment normal.     Flowsheet Row Office Visit from 02/28/2023 in [facility] [place]  PHQ-9 Total Score 9        Impression and Plan:  Encounter for preventive health examination  Bipolar 2 disorder (HCC) -     CBC with Differential/Platelet; Future  Chronic kidney disease, stage 3a (HCC) -     Comprehensive metabolic panel; Future -     Lipid panel; Future -     TSH; Future -     Vitamin B12; Future -     VITAMIN D 25 Hydroxy (Vit-D Deficiency, Fractures); Future -     Hemoglobin A1c; Future  Immunization due   -Recommend routine eye and dental care. -Healthy lifestyle discussed in detail. -Labs to be updated today. -Prostate cancer screening: N/A Health Maintenance  Topic Date Due   DTaP/Tdap/Td vaccine (1 - Tdap) Never done   Pneumococcal Vaccination (2 of 2 - PCV) 04/27/2016   Zoster (Shingles) Vaccine (1 of 2) Never done   COVID-19 Vaccine (3 - 2024-25 season) 04/02/2023   Cologuard (Stool DNA test)  06/10/2024   Mammogram  08/25/2024   Pap with HPV screening  07/09/2028   Flu Shot  Completed   HPV Vaccine  Aged Out   Hepatitis C Screening  Discontinued   HIV Screening  Discontinued     -Tdap in office today. -Upcoming mammogram appointment later this month.  Will be due for Cologuard in November.     Chaya Jan, MD [facility] at [facility]

## 2023-08-17 NOTE — Addendum Note (Signed)
Addended by: Kern Reap B on: 08/17/2023 03:41 PM   Modules accepted: Orders

## 2023-08-28 ENCOUNTER — Ambulatory Visit
Admission: RE | Admit: 2023-08-28 | Discharge: 2023-08-28 | Disposition: A | Discharge status: Home / Self Care | Payer: 59 | Source: Ambulatory Visit | Attending: Internal Medicine | Admitting: Internal Medicine

## 2023-08-28 DIAGNOSIS — Z1231 Encounter for screening mammogram for malignant neoplasm of breast: Secondary | ICD-10-CM | POA: Diagnosis not present

## 2023-09-01 ENCOUNTER — Encounter: Payer: Self-pay | Admitting: Internal Medicine

## 2023-09-04 ENCOUNTER — Encounter: Payer: Self-pay | Admitting: Internal Medicine

## 2024-06-17 ENCOUNTER — Encounter: Payer: Self-pay | Admitting: Physician Assistant

## 2024-06-17 DIAGNOSIS — Z1231 Encounter for screening mammogram for malignant neoplasm of breast: Secondary | ICD-10-CM

## 2024-07-11 NOTE — Progress Notes (Deleted)
 57 y.o. [id number] Divorced Caucasian female here for annual exam.    PCP: Theophilus Andrews, Tully GRADE, MD   No LMP recorded. Patient is postmenopausal.           Sexually active: No.  The current method of family planning is post menopausal status.    Menopausal hormone therapy:  n/a Exercising: {yes no:314532}  {types:19826} Smoker:  Former   OB History  Gravida Para Term Preterm AB Living  4 2 2  2 2   SAB IAB Ectopic Multiple Live Births  2        # Outcome Date GA Lbr Len/2nd Weight Sex Type Anes PTL Lv  4 SAB           3 SAB           2 Term           1 Term              HEALTH MAINTENANCE: Last 2 paps:  07/10/23 neg HR HPV neg, 01/10/23 neg: HR HPV neg, 06/27/22 ASC-H: HR HPV neg.  Colposcopy:  LGSIL  History of abnormal Pap or positive HPV:  yes Mammogram:   08/28/23 Breast Density Cat B, BIRADS Cat 1 neg  Colonoscopy:  cologuard 2022 per pt  Bone Density:  n/a  Result  n/a   Immunization History  Administered Date(s) Administered   Influenza Inj Mdck Quad Pf 05/16/2017   Influenza, Seasonal, Injecte, Preservative Fre 07/10/2023   Influenza,inj,Quad PF,6+ Mos 04/28/2015, 04/20/2016, 05/14/2018, 08/22/2019, 06/27/2022   Influenza-Unspecified 05/01/2013   PFIZER(Purple Top)SARS-COV-2 Vaccination 11/05/2019, 11/27/2019   Pneumococcal Polysaccharide-23 04/28/2015   Tdap 08/17/2023      reports that she quit smoking about 6 years ago. Her smoking use included cigarettes and e-cigarettes. She started smoking about 21 years ago. She has a 3.8 pack-year smoking history. She does not have any smokeless tobacco history on file. She reports that she does not currently use alcohol. She reports that she does not currently use drugs after having used the following drugs: Amphetamines, Benzodiazepines, Cocaine, Codeine, Hydrocodone, Marijuana, and Methamphetamines.  Past Medical History:  Diagnosis Date   Abnormal Pap smear of cervix    Anxiety Ongoing   Bipolar 1 disorder  (HCC)    Depression Ongoing   Substance abuse (HCC) 2005   In NA -clean 6 month   Suicide attempt [facility]) 2020   Well woman exam with routine gynecological exam 06/27/2022    Past Surgical History:  Procedure Laterality Date   AUGMENTATION MAMMAPLASTY Bilateral    BREAST SURGERY  Augmentation   CERVICAL BIOPSY  W/ LOOP ELECTRODE EXCISION     CESAREAN SECTION  4/02   COSMETIC SURGERY  6/96, 6/16   Breast augment   CRYOTHERAPY     abnormal pap smear   EYE SURGERY  08/1998   Lasik - eyes reverted after a few years/wear hard contact lenses   SPINE SURGERY  10/12   Fuse C4/5    Current Outpatient Medications  Medication Sig Dispense Refill   lamoTRIgine (LAMICTAL) 150 MG tablet Take 150 mg by mouth daily.     Lurasidone HCl 120 MG TABS Take 1 tablet by mouth daily.     PROZAC 40 MG capsule Take 40 mg by mouth daily.     tirzepatide  (ZEPBOUND ) 2.5 MG/0.5ML Pen Inject 2.5 mg into the skin once a week. 2 mL 0   No current facility-administered medications for this visit.    ALLERGIES: Patient has no known  allergies.  Family History  Problem Relation Age of Onset   Arthritis Mother    Cancer Mother    Lung cancer Mother    Anxiety disorder Father    Cancer Father    Depression Father    Prostate cancer Father    ADD / ADHD Brother    Drug abuse Brother    Heart attack Maternal Grandfather    Heart attack Paternal Grandfather     Review of Systems  PHYSICAL EXAM:  There were no vitals taken for this visit.    General appearance: alert, cooperative and appears stated age Head: normocephalic, without obvious abnormality, atraumatic Neck: no adenopathy, supple, symmetrical, trachea midline and thyroid  normal to inspection and palpation Lungs: clear to auscultation bilaterally Breasts: normal appearance, no masses or tenderness, No nipple retraction or dimpling, No nipple discharge or bleeding, No axillary adenopathy Heart: regular rate and rhythm Abdomen: soft,  non-tender; no masses, no organomegaly Extremities: extremities normal, atraumatic, no cyanosis or edema Skin: skin color, texture, turgor normal. No rashes or lesions Lymph nodes: cervical, supraclavicular, and axillary nodes normal. Neurologic: grossly normal  Pelvic: External genitalia:  no lesions              No abnormal inguinal nodes palpated.              Urethra:  normal appearing urethra with no masses, tenderness or lesions              Bartholins and Skenes: normal                 Vagina: normal appearing vagina with normal color and discharge, no lesions              Cervix: no lesions              Pap taken: {yes no:314532} Bimanual Exam:  Uterus:  normal size, contour, position, consistency, mobility, non-tender              Adnexa: no mass, fullness, tenderness              Rectal exam: {yes no:314532}.  Confirms.              Anus:  normal sphincter tone, no lesions  Chaperone was present for exam:  {BSCHAPERONE:31226::Emily F, CMA}  ASSESSMENT: Well woman visit with gynecologic exam.  PHQ-2-9: ***  ***  PLAN: Mammogram screening discussed. Self breast awareness reviewed. Pap and HRV collected:  {yes no:314532} Guidelines for Calcium, Vitamin D , regular exercise program including cardiovascular and weight bearing exercise. Medication refills:  *** {LABS (Optional):23779} Follow up:  ***    Additional counseling given.  {yes [id number]. ***  total time was spent for this patient encounter, including preparation, face-to-face counseling with the patient, coordination of care, and documentation of the encounter in addition to doing the well woman visit with gynecologic exam.

## 2024-07-15 ENCOUNTER — Ambulatory Visit: Payer: 59 | Admitting: Obstetrics and Gynecology

## 2024-08-20 LAB — COLOGUARD: COLOGUARD: NEGATIVE
